# Patient Record
Sex: Female | Born: 1980 | Race: White | Hispanic: No | Smoking: Current every day smoker
Health system: Southern US, Community
[De-identification: ages and names within clinical notes are randomized; demographics above are authoritative.]

## PROBLEM LIST (undated history)

## (undated) DIAGNOSIS — C801 Malignant (primary) neoplasm, unspecified: Secondary | ICD-10-CM

## (undated) HISTORY — PX: BREAST SURGERY: SHX581

## (undated) HISTORY — PX: CHOLECYSTECTOMY: SHX55

---

## 2004-02-29 HISTORY — PX: MASTECTOMY: SHX3

## 2009-11-17 DIAGNOSIS — F17200 Nicotine dependence, unspecified, uncomplicated: Secondary | ICD-10-CM

## 2009-11-17 DIAGNOSIS — F419 Anxiety disorder, unspecified: Secondary | ICD-10-CM

## 2009-11-17 DIAGNOSIS — C50919 Malignant neoplasm of unspecified site of unspecified female breast: Secondary | ICD-10-CM

## 2009-11-17 HISTORY — DX: Malignant neoplasm of unspecified site of unspecified female breast: C50.919

## 2009-11-17 HISTORY — DX: Anxiety disorder, unspecified: F41.9

## 2009-11-17 HISTORY — DX: Nicotine dependence, unspecified, uncomplicated: F17.200

## 2010-01-14 DIAGNOSIS — J302 Other seasonal allergic rhinitis: Secondary | ICD-10-CM

## 2010-01-14 HISTORY — DX: Other seasonal allergic rhinitis: J30.2

## 2010-08-26 DIAGNOSIS — E559 Vitamin D deficiency, unspecified: Secondary | ICD-10-CM

## 2010-08-26 HISTORY — DX: Vitamin D deficiency, unspecified: E55.9

## 2011-02-07 DIAGNOSIS — B977 Papillomavirus as the cause of diseases classified elsewhere: Secondary | ICD-10-CM | POA: Insufficient documentation

## 2011-02-07 HISTORY — DX: Papillomavirus as the cause of diseases classified elsewhere: B97.7

## 2011-07-11 DIAGNOSIS — Z302 Encounter for sterilization: Secondary | ICD-10-CM

## 2011-07-11 HISTORY — DX: Encounter for sterilization: Z30.2

## 2013-06-12 DIAGNOSIS — H5210 Myopia, unspecified eye: Secondary | ICD-10-CM

## 2013-06-12 DIAGNOSIS — H52209 Unspecified astigmatism, unspecified eye: Secondary | ICD-10-CM

## 2013-06-12 DIAGNOSIS — IMO0002 Reserved for concepts with insufficient information to code with codable children: Secondary | ICD-10-CM

## 2013-06-12 HISTORY — DX: Reserved for concepts with insufficient information to code with codable children: IMO0002

## 2013-06-12 HISTORY — DX: Unspecified astigmatism, unspecified eye: H52.10

## 2013-06-12 HISTORY — DX: Unspecified astigmatism, unspecified eye: H52.209

## 2017-10-07 ENCOUNTER — Emergency Department (HOSPITAL_BASED_OUTPATIENT_CLINIC_OR_DEPARTMENT_OTHER): Payer: Self-pay

## 2017-10-07 ENCOUNTER — Other Ambulatory Visit: Payer: Self-pay

## 2017-10-07 ENCOUNTER — Emergency Department (HOSPITAL_BASED_OUTPATIENT_CLINIC_OR_DEPARTMENT_OTHER)
Admission: EM | Admit: 2017-10-07 | Discharge: 2017-10-07 | Disposition: A | Discharge status: Home / Self Care | Payer: Self-pay | Attending: Emergency Medicine | Admitting: Emergency Medicine

## 2017-10-07 ENCOUNTER — Encounter (HOSPITAL_BASED_OUTPATIENT_CLINIC_OR_DEPARTMENT_OTHER): Payer: Self-pay | Admitting: Emergency Medicine

## 2017-10-07 DIAGNOSIS — Z9010 Acquired absence of unspecified breast and nipple: Secondary | ICD-10-CM | POA: Insufficient documentation

## 2017-10-07 DIAGNOSIS — F17210 Nicotine dependence, cigarettes, uncomplicated: Secondary | ICD-10-CM | POA: Insufficient documentation

## 2017-10-07 DIAGNOSIS — Z853 Personal history of malignant neoplasm of breast: Secondary | ICD-10-CM | POA: Insufficient documentation

## 2017-10-07 DIAGNOSIS — R202 Paresthesia of skin: Secondary | ICD-10-CM | POA: Insufficient documentation

## 2017-10-07 HISTORY — DX: Malignant (primary) neoplasm, unspecified: C80.1

## 2017-10-07 MED ORDER — IBUPROFEN 400 MG PO TABS
600.0000 mg | ORAL_TABLET | Freq: Once | ORAL | Status: AC
  Administered 2017-10-07: 600 mg via ORAL
  Filled 2017-10-07: qty 1

## 2017-10-07 NOTE — ED Provider Notes (Signed)
[facility] Provider Note   CSN: [id number] Arrival date & time: 10/07/17  [place]     History   Chief Complaint Chief Complaint  Patient presents with  . Numbness    HPI Brandi Alvarado is a 37 y.o. female.  Brandi Alvarado is a 37 y.o. Female with history of breast cancer S/P mastectomy, who presents to the emergency department for evaluation of numbness and tingling in her left thumb index and middle finger, this started around 2 PM today.  She reports she is right-hand dominant, works as a Chief Operating Officer, but does not use the left hand for a lot of pouring her activities at work.  She has not noticed the symptoms prior to today.  She does report that since that she noticed this she is also noticed some tingling and discomfort going up the back of her arm, no pain in the shoulder no difficulty moving the shoulder, elbow or wrist.  No difficulty gripping objects.  Patient does report she has had some tension in her neck after sleeping on it oddly.  She denies any associated headaches, vision changes, headache she has felt dizzy occasionally she describes this as feeling "fuzzy headed" this is happened intermittently though for a long time and is not new today, she had the symptoms briefly and they have since resolved.  Patient does wear glasses and reports she has not had her prescription updated in several years.  No nausea or vomiting.  No weakness in any of her extremities, no facial asymmetry, difficulty with speech or swallowing.  She does report that she sleeps balled up and wonders if she slept oddly on the arm.  She has not taken anything prior to arrival to manage her symptoms.  No associated chest pain, or shortness of breath.      Past Medical History:  Diagnosis Date  . Cancer [facility])    breast    There are no active problems to display for this patient.   Past Surgical History:  Procedure Laterality Date  . BREAST SURGERY    . CHOLECYSTECTOMY        OB History   None      Home Medications    Prior to Admission medications   Not on File    Family History No family history on file.  Social History Social History   Tobacco Use  . Smoking status: Current Every Day Smoker  . Smokeless tobacco: Never Used  Substance Use Topics  . Alcohol use: Yes    Frequency: Never    Comment: rarely  . Drug use: Never     Allergies   Amoxicillin and Zoloft [sertraline hcl]   Review of Systems Review of Systems  Constitutional: Negative for chills and fever.  HENT: Negative for congestion, rhinorrhea and sore throat.   Eyes: Negative for visual disturbance.  Respiratory: Negative for cough and shortness of breath.   Cardiovascular: Negative for chest pain and palpitations.  Gastrointestinal: Negative for abdominal pain and nausea.  Musculoskeletal: Positive for neck pain. Negative for arthralgias, back pain, joint swelling and neck stiffness.  Skin: Negative for color change and rash.  Neurological: Positive for numbness. Negative for tremors, syncope, facial asymmetry, speech difficulty, weakness, light-headedness and headaches.       Paresthesias     Physical Exam Updated Vital Signs BP 107/61 (BP Location: Right Arm)   Pulse 60   Temp 98.5 F (36.9 C) (Oral)   Resp 16   Ht 5\' 5"  (1.651  m)   Wt 81.6 kg   LMP 10/05/2017   SpO2 99%   BMI 29.95 kg/m   Physical Exam  Constitutional: She is oriented to person, place, and time. She appears well-developed and well-nourished. No distress.  HENT:  Head: Normocephalic and atraumatic.  Eyes: Pupils are equal, round, and reactive to light. EOM are normal. Right eye exhibits no discharge. Left eye exhibits no discharge.  Neck: Normal range of motion. Neck supple.  No midline C-spine tenderness, she does have some tenderness and palpable muscle tension along the left paraspinal muscles and trapezius, range of motion intact  Cardiovascular: Normal rate, regular rhythm,  normal heart sounds and intact distal pulses.  Pulmonary/Chest: Effort normal and breath sounds normal. No respiratory distress.  Respirations equal and unlabored, patient able to speak in full sentences, lungs clear to auscultation bilaterally  Musculoskeletal:  No palpable deformity, erythema or swelling over the left hand or left upper extremity, normal range of motion of fingers and all joints  Neurological: She is alert and oriented to person, place, and time. Coordination normal.  Speech is clear, able to follow commands CN III-XII intact Normal strength in upper and lower extremities bilaterally including dorsiflexion and plantar flexion, strong and equal grip strength Sensation normal to light and sharp touch Moves extremities without ataxia, coordination intact Normal finger to nose and rapid alternating movements No pronator drift  Skin: Skin is warm and dry. She is not diaphoretic.  Psychiatric: She has a normal mood and affect. Her behavior is normal.  Nursing note and vitals reviewed.    ED Treatments / Results  Labs (all labs ordered are listed, but only abnormal results are displayed) Labs Reviewed - No data to display  EKG None  Radiology Dg Cervical Spine Complete  Result Date: 10/07/2017 CLINICAL DATA:  Numbness and tingling in left thumb, index finger, and middle finger. EXAM: CERVICAL SPINE - COMPLETE 4+ VIEW COMPARISON:  None. FINDINGS: The pre odontoid space and prevertebral soft tissues are normal. No malalignment. No fracture or bony lesion noted. The neural foramina are patent. The lateral masses of C1 align with C2. The odontoid process is normal. The lung apices are unremarkable. IMPRESSION: Negative cervical spine radiographs. Electronically Signed   By: Dorise Bullion III M.D   On: 10/07/2017 20:43    Procedures Procedures (including critical care time)  Medications Ordered in ED Medications  ibuprofen (ADVIL,MOTRIN) tablet 600 mg (600 mg Oral Given  10/07/17 2151)     Initial Impression / Assessment and Plan / ED Course  I have reviewed the triage vital signs and the nursing notes.  Pertinent labs & imaging results that were available during my care of the patient were reviewed by me and considered in my medical decision making (see chart for details).  Patient presents for evaluation of numbness and tingling in the left first 3 fingers, she is not left-hand dominant and does not have prior history of carpal tunnel or the symptoms.  Reports she has been having some intermittent disease feeling, but this is been going on much longer and is not newly associated with her symptoms today.  Patient's sensory changes are in the C7 dermatome, and I feel this could potentially be due to carpal tunnel syndrome versus cervical radiculopathy.  She does have some paraspinal muscle tension.  She has a normal neurologic exam today, and no other neurologic symptoms to suggest a central etiology.  X-ray of the C-spine shows no acute injury or degenerative change.  Will  place patient and cock-up wrist splint, and treat with anti-inflammatories, she is to follow-up with sports medicine if symptoms do not improve.  Return precautions discussed.  Patient expresses understanding and is in agreement with plan.  Final Clinical Impressions(s) / ED Diagnoses   Final diagnoses:  Paresthesia    ED Discharge Orders    None       Janet Berlin 10/08/17 2146    Isla Pence, MD 10/08/17 2223

## 2017-10-07 NOTE — ED Notes (Signed)
PMS intact before and after. Pt tolerated well. All questions answered. 

## 2017-10-07 NOTE — ED Triage Notes (Signed)
Patient states that she started to have numbness and tingling to her left thumb, index finger and middle finger around 2 pm. 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 The patient states that she is dizzy as well

## 2017-10-07 NOTE — Discharge Instructions (Signed)
I think the symptoms in your left hand are likely either due to carpal tunnel or irritation of 1 of the nerves at your neck, please use wrist splint, especially when you are sleeping, and use ibuprofen or Aleve to help with inflammation.  If symptoms are still not improving please follow-up with Dr. Karlton Lemon with sports medicine.  Return for worsening symptoms, weakness of the arm or leg, vision changes, severe headache or any other new or concerning symptoms.

## 2019-08-06 DIAGNOSIS — R269 Unspecified abnormalities of gait and mobility: Secondary | ICD-10-CM | POA: Diagnosis not present

## 2019-08-06 DIAGNOSIS — M791 Myalgia, unspecified site: Secondary | ICD-10-CM | POA: Diagnosis not present

## 2019-08-06 DIAGNOSIS — M25551 Pain in right hip: Secondary | ICD-10-CM | POA: Diagnosis not present

## 2019-08-13 DIAGNOSIS — M79671 Pain in right foot: Secondary | ICD-10-CM | POA: Diagnosis not present

## 2019-08-13 DIAGNOSIS — M25551 Pain in right hip: Secondary | ICD-10-CM | POA: Diagnosis not present

## 2019-08-13 DIAGNOSIS — M791 Myalgia, unspecified site: Secondary | ICD-10-CM | POA: Diagnosis not present

## 2019-08-13 DIAGNOSIS — M542 Cervicalgia: Secondary | ICD-10-CM | POA: Diagnosis not present

## 2019-09-03 DIAGNOSIS — M25551 Pain in right hip: Secondary | ICD-10-CM | POA: Diagnosis not present

## 2019-09-03 DIAGNOSIS — M79671 Pain in right foot: Secondary | ICD-10-CM | POA: Diagnosis not present

## 2019-09-03 DIAGNOSIS — M791 Myalgia, unspecified site: Secondary | ICD-10-CM | POA: Diagnosis not present

## 2019-09-03 DIAGNOSIS — M542 Cervicalgia: Secondary | ICD-10-CM | POA: Diagnosis not present

## 2019-09-17 DIAGNOSIS — M542 Cervicalgia: Secondary | ICD-10-CM | POA: Diagnosis not present

## 2019-09-17 DIAGNOSIS — M791 Myalgia, unspecified site: Secondary | ICD-10-CM | POA: Diagnosis not present

## 2019-09-17 DIAGNOSIS — M79671 Pain in right foot: Secondary | ICD-10-CM | POA: Diagnosis not present

## 2019-09-17 DIAGNOSIS — M25551 Pain in right hip: Secondary | ICD-10-CM | POA: Diagnosis not present

## 2019-10-09 DIAGNOSIS — R002 Palpitations: Secondary | ICD-10-CM | POA: Diagnosis not present

## 2019-10-09 DIAGNOSIS — E663 Overweight: Secondary | ICD-10-CM | POA: Diagnosis not present

## 2019-10-09 DIAGNOSIS — E559 Vitamin D deficiency, unspecified: Secondary | ICD-10-CM | POA: Diagnosis not present

## 2019-10-09 DIAGNOSIS — K219 Gastro-esophageal reflux disease without esophagitis: Secondary | ICD-10-CM | POA: Diagnosis not present

## 2019-10-09 DIAGNOSIS — T8544XA Capsular contracture of breast implant, initial encounter: Secondary | ICD-10-CM | POA: Diagnosis not present

## 2019-12-04 ENCOUNTER — Encounter: Payer: Self-pay | Admitting: Cardiology

## 2019-12-11 DIAGNOSIS — C801 Malignant (primary) neoplasm, unspecified: Secondary | ICD-10-CM | POA: Insufficient documentation

## 2019-12-13 ENCOUNTER — Other Ambulatory Visit: Payer: Self-pay

## 2019-12-13 ENCOUNTER — Ambulatory Visit (INDEPENDENT_AMBULATORY_CARE_PROVIDER_SITE_OTHER): Payer: BC Managed Care – PPO | Admitting: Cardiology

## 2019-12-13 ENCOUNTER — Encounter: Payer: Self-pay | Admitting: Cardiology

## 2019-12-13 VITALS — BP 110/80 | HR 84 | Ht 65.0 in | Wt 175.8 lb

## 2019-12-13 DIAGNOSIS — R0602 Shortness of breath: Secondary | ICD-10-CM

## 2019-12-13 DIAGNOSIS — I471 Supraventricular tachycardia: Secondary | ICD-10-CM

## 2019-12-13 MED ORDER — METOPROLOL SUCCINATE ER 25 MG PO TB24: 12.5000 mg | ORAL_TABLET | Freq: Every day | ORAL | 1 refills | Status: DC

## 2019-12-13 NOTE — Progress Notes (Signed)
Cardiology Office Note:    Date:  12/13/2019   ID:  Fabio Asa Hoelzel, DOB October 27, 1980, MRN [id number]  PCP:  Leilani Able, FNP  Cardiologist:  Berniece Salines, DO  Electrophysiologist:  None   Referring MD: Farrel Conners*   " I was referred for irregular heart rate"  History of Present Illness:    Brandi Alvarado is a 39 y.o. female with a hx of breast cancer status post lumpectomy of the left breast with chemo, history of anxiety not on any medication presents today after she was sent by her PCP due to an abnormal ambulatory monitor.  The patient reported that she did have some palpitations and she spoke with her PCP about this as a monitor was placed on the patient for 14 days and the results of the ZIO monitor was back the patient was asked to see cardiology.  Today in the office he denies any chest pain, shortness of breath, lightheadedness or dizziness.  She described a previous capitation's as abrupt onset of fast heart rate.  He has never had any syncope episode.   Past Medical History:  Diagnosis Date  . Anxiety 11/17/2009  . Breast cancer ([place]) 11/17/2009   Formatting of this note might be different from the original. L side- mastectomy Formatting of this note might be different from the original. Left breast age 4 -[name], total mastectomy with lymphadenectomy Stage 3 (29/37 nodes affected) - chemo (3 drug regimen), herceptin x 3 treatments - cardiomypathy d/c'd, did not tolerate Tamoxifen  . Cancer [facility])    breast  . Encounter for sterilization 07/11/2011  . 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 Cryo > 10 years ago  . 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 Posterior subcapsular polar nonsenile cataract 06/12/2013  . Seasonal allergies 01/14/2010  . Tobacco dependency 11/17/2009  . Vitamin D deficiency 08/26/2010    Past Surgical History:    Procedure Laterality Date  . BREAST SURGERY    . CHOLECYSTECTOMY    . MASTECTOMY  2006   Left     Current Medications: No outpatient medications have been marked as taking for the 12/13/19 encounter (Office Visit) with Berniece Salines, DO.     Allergies:   Amoxicillin, Zoloft [sertraline], Tape, and Zoloft [sertraline hcl]   Social History   Socioeconomic History  . Marital status: Married    Spouse name: Not on file  . Number of children: Not on file  . Years of education: Not on file  . Highest education level: Not on file  Occupational History  . Not on file  Tobacco Use  . Smoking status: Current Every Day Smoker    Packs/day: 0.50    Types: Cigarettes  . Smokeless tobacco: Never Used  Substance and Sexual Activity  . Alcohol use: Yes    Comment: rarely  . Drug use: Never  . Sexual activity: Not on file  Other Topics Concern  . Not on file  Social History Narrative  . Not on file   Social Determinants of Health   Financial Resource Strain:   . Difficulty of Paying Living Expenses: Not on file  Food Insecurity:   . Worried About Charity fundraiser in the Last Year: Not on file  . Ran Out of Food in the Last Year: Not on file  Transportation Needs:   . Lack of Transportation (Medical): Not on file  .  Lack of Transportation (Non-Medical): Not on file  Physical Activity:   . Days of Exercise per Week: Not on file  . Minutes of Exercise per Session: Not on file  Stress:   . Feeling of Stress : Not on file  Social Connections:   . Frequency of Communication with Friends and Family: Not on file  . Frequency of Social Gatherings with Friends and Family: Not on file  . Attends Religious Services: Not on file  . Active Member of Clubs or Organizations: Not on file  . Attends Archivist Meetings: Not on file  . Marital Status: Not on file     Family History: The patient's family history includes Colon cancer in her mother; Hypertension in her father  and sister.  ROS:   Review of Systems  Constitution: Negative for decreased appetite, fever and weight gain.  HENT: Negative for congestion, ear discharge, hoarse voice and sore throat.   Eyes: Negative for discharge, redness, vision loss in right eye and visual halos.  Cardiovascular: Palpitations.  Negative for chest pain, dyspnea on exertion, leg swelling, orthopnea. Respiratory: Negative for cough, hemoptysis, shortness of breath and snoring.   Endocrine: Negative for heat intolerance and polyphagia.  Hematologic/Lymphatic: Negative for bleeding problem. Does not bruise/bleed easily.  Skin: Negative for flushing, nail changes, rash and suspicious lesions.  Musculoskeletal: Negative for arthritis, joint pain, muscle cramps, myalgias, neck pain and stiffness.  Gastrointestinal: Negative for abdominal pain, bowel incontinence, diarrhea and excessive appetite.  Genitourinary: Negative for decreased libido, genital sores and incomplete emptying.  Neurological: Negative for brief paralysis, focal weakness, headaches and loss of balance.  Psychiatric/Behavioral: Negative for altered mental status, depression and suicidal ideas.  Allergic/Immunologic: Negative for HIV exposure and persistent infections.    EKGs/Labs/Other Studies Reviewed:    The following studies were reviewed today:   EKG:  The ekg ordered today demonstrates sinus rhythm, heart rate 84 bpm no prior EKG for comparison.  I was able to obtain a copy of the patient's ZIO monitor which show evidence of 50 episodes supraventricular tachycardia, underlying rhythm was sinus.  Minimum heart rate 40 bpm, maximum heart rate 230 bpm and average heart rate 80 bpm.  With rare PACs and PVCs.  Recent Labs: No results found for requested labs within last 8760 hours.  Recent Lipid Panel No results found for: CHOL, TRIG, HDL, CHOLHDL, VLDL, LDLCALC, LDLDIRECT  Physical Exam:    VS:  BP 110/80 (BP Location: Right Arm, Patient Position:  Sitting, Cuff Size: Normal)   Pulse 84   Ht 5\' 5"  (1.651 m)   Wt 175 lb 12.8 oz (79.7 kg)   SpO2 95%   BMI 29.25 kg/m     Wt Readings from Last 3 Encounters:  12/13/19 175 lb 12.8 oz (79.7 kg)  10/07/17 180 lb (81.6 kg)     GEN: Well nourished, well developed in no acute distress HEENT: Normal NECK: No JVD; No carotid bruits LYMPHATICS: No lymphadenopathy CARDIAC: S1S2 noted,RRR, no murmurs, rubs, gallops RESPIRATORY:  Clear to auscultation without rales, wheezing or rhonchi  ABDOMEN: Soft, non-tender, non-distended, +bowel sounds, no guarding. EXTREMITIES: No edema, No cyanosis, no clubbing MUSCULOSKELETAL:  No deformity  SKIN: Warm and dry NEUROLOGIC:  Alert and oriented x 3, non-focal PSYCHIATRIC:  Normal affect, good insight  ASSESSMENT:    1. Paroxysmal atrial tachycardia ([place])   2. Shortness of breath    PLAN:     Her monitor results is concerning for paroxysmal atrial tachycardia.  Again to  start the patient on low-dose beta-blocker metoprolol succinate 12.5 mg daily.  Educated the patient about what the rhythm beats to have particular atrial tachycardia, all of her questions has been answered.  Her husband is present during this visit.  She will need an echocardiogram to assess LV function and any other structural abnormalities in the setting of her shortness of breath.  Her blood pressure deceptively in the office no changes will be made to antihypertensive regimen.  The patient is in agreement with the above plan. The patient left the office in stable condition.  The patient will follow up in   Medication Adjustments/Labs and Tests Ordered: Current medicines are reviewed at length with the patient today.  Concerns regarding medicines are outlined above.  Orders Placed This Encounter  Procedures  . ECHOCARDIOGRAM COMPLETE   Meds ordered this encounter  Medications  . metoprolol succinate (TOPROL-XL) 25 MG 24 hr tablet    Sig: Take 0.5 tablets (12.5 mg  total) by mouth daily. Take with or immediately following a meal.    Dispense:  45 tablet    Refill:  1    Patient Instructions  Medication Instructions:  Your physician has recommended you make the following change in your medication:  START: Metoprolol succinate 12.5 mg daily   *If you need a refill on your cardiac medications before your next appointment, please call your pharmacy*   Lab Work: None.  If you have labs (blood work) drawn today and your tests are completely normal, you will receive your results only by: Marland Kitchen MyChart Message (if you have MyChart) OR . A paper copy in the mail If you have any lab test that is abnormal or we need to change your treatment, we will call you to review the results.   Testing/Procedures: Your physician has requested that you have an echocardiogram. Echocardiography is a painless test that uses sound waves to create images of your heart. It provides your doctor with information about the size and shape of your heart and how well your heart's chambers and valves are working. This procedure takes approximately one hour. There are no restrictions for this procedure.     Follow-Up: At [facility], you and your health needs are our priority.  As part of our continuing mission to provide you with exceptional heart care, we have created designated Provider Care Teams.  These Care Teams include your primary Cardiologist (physician) and Advanced Practice Providers (APPs -  Physician Assistants and Nurse Practitioners) who all work together to provide you with the care you need, when you need it.  We recommend signing up for the patient portal called "MyChart".  Sign up information is provided on this After Visit Summary.  MyChart is used to connect with patients for Virtual Visits (Telemedicine).  Patients are able to view lab/test results, encounter notes, upcoming appointments, etc.  Non-urgent messages can be sent to your provider as well.   To  learn more about what you can do with MyChart, go to [email or web address].    Your next appointment:   1 month(s)  The format for your next appointment:   In Person  Provider:   Berniece Salines, DO   Other Instructions   Echocardiogram An echocardiogram is a procedure that uses painless sound waves (ultrasound) to produce an image of the heart. Images from an echocardiogram can provide important information about:  Signs of coronary artery disease (CAD).  Aneurysm detection. An aneurysm is a weak or damaged part of an artery wall  that bulges out from the normal force of blood pumping through the body.  Heart size and shape. Changes in the size or shape of the heart can be associated with certain conditions, including heart failure, aneurysm, and CAD.  Heart muscle function.  Heart valve function.  Signs of a past heart attack.  Fluid buildup around the heart.  Thickening of the heart muscle.  A tumor or infectious growth around the heart valves. Tell a health care provider about:  Any allergies you have.  All medicines you are taking, including vitamins, herbs, eye drops, creams, and over-the-counter medicines.  Any blood disorders you have.  Any surgeries you have had.  Any medical conditions you have.  Whether you are pregnant or may be pregnant. What are the risks? Generally, this is a safe procedure. However, problems may occur, including:  Allergic reaction to dye (contrast) that may be used during the procedure. What happens before the procedure? No specific preparation is needed. You may eat and drink normally. What happens during the procedure?   An IV tube may be inserted into one of your veins.  You may receive contrast through this tube. A contrast is an injection that improves the quality of the pictures from your heart.  A gel will be applied to your chest.  A wand-like tool (transducer) will be moved over your chest. The gel will help to  transmit the sound waves from the transducer.  The sound waves will harmlessly bounce off of your heart to allow the heart images to be captured in real-time motion. The images will be recorded on a computer. The procedure may vary among health care providers and hospitals. What happens after the procedure?  You may return to your normal, everyday life, including diet, activities, and medicines, unless your health care provider tells you not to do that. Summary  An echocardiogram is a procedure that uses painless sound waves (ultrasound) to produce an image of the heart.  Images from an echocardiogram can provide important information about the size and shape of your heart, heart muscle function, heart valve function, and fluid buildup around your heart.  You do not need to do anything to prepare before this procedure. You may eat and drink normally.  After the echocardiogram is completed, you may return to your normal, everyday life, unless your health care provider tells you not to do that. This information is not intended to replace advice given to you by your health care provider. Make sure you discuss any questions you have with your health care provider. Document Revised: 06/07/2018 Document Reviewed: 03/19/2016 Elsevier Patient Education  [place].  Metoprolol Extended-Release Tablets What is this medicine? METOPROLOL (me TOE proe lole) is a beta blocker. It decreases the amount of work your heart has to do and helps your heart beat regularly. It treats high blood pressure and/or prevent chest pain (also called angina). It also treats heart failure. This medicine may be used for other purposes; ask your health care provider or pharmacist if you have questions. COMMON BRAND NAME(S): toprol, Toprol XL What should I tell my health care provider before I take this medicine? They need to know if you have any of these conditions:  diabetes  heart or vessel disease like slow  heart rate, worsening heart failure, heart block, sick sinus syndrome or Raynaud's disease  kidney disease  liver disease  lung or breathing disease, like asthma or emphysema  pheochromocytoma  thyroid disease  an unusual or allergic  reaction to metoprolol, other beta-blockers, medicines, foods, dyes, or preservatives  pregnant or trying to get pregnant  breast-feeding How should I use this medicine? Take this drug by mouth. Take it as directed on the prescription label at the same time every day. Take it with food. You may cut the tablet in half if it is scored (has a line in the middle of it). This may help you swallow the tablet if the whole tablet is too big. Be sure to take both halves. Do not take just one-half of the tablet. Keep taking it unless your health care provider tells you to stop. Talk to your health care provider about the use of this drug in children. While it may be prescribed for children as young as 6 for selected conditions, precautions do apply. Overdosage: If you think you have taken too much of this medicine contact a poison control center or emergency room at once. NOTE: This medicine is only for you. Do not share this medicine with others. What if I miss a dose? If you miss a dose, take it as soon as you can. If it is almost time for your next dose, take only that dose. Do not take double or extra doses. What may interact with this medicine? This medicine may interact with the following medications:  certain medicines for blood pressure, heart disease, irregular heart beat  certain medicines for depression, like monoamine oxidase (MAO) inhibitors, fluoxetine, or paroxetine  clonidine  dobutamine  epinephrine  isoproterenol  reserpine This list may not describe all possible interactions. Give your health care provider a list of all the medicines, herbs, non-prescription drugs, or dietary supplements you use. Also tell them if you smoke, drink  alcohol, or use illegal drugs. Some items may interact with your medicine. What should I watch for while using this medicine? Visit your doctor or health care professional for regular check ups. Contact your doctor right away if your symptoms worsen. Check your blood pressure and pulse rate regularly. Ask your health care professional what your blood pressure and pulse rate should be, and when you should contact them. You may get drowsy or dizzy. Do not drive, use machinery, or do anything that needs mental alertness until you know how this medicine affects you. Do not sit or stand up quickly, especially if you are an older patient. This reduces the risk of dizzy or fainting spells. Contact your doctor if these symptoms continue. Alcohol may interfere with the effect of this medicine. Avoid alcoholic drinks. This medicine may increase blood sugar. Ask your healthcare provider if changes in diet or medicines are needed if you have diabetes. What side effects may I notice from receiving this medicine? Side effects that you should report to your doctor or health care professional as soon as possible:  allergic reactions like skin rash, itching or hives  cold or numb hands or feet  depression  difficulty breathing  faint  fever with sore throat  irregular heartbeat, chest pain  rapid weight gain   signs and symptoms of high blood sugar such as being more thirsty or hungry or having to urinate more than normal. You may also feel very tired or have blurry vision.  swollen legs or ankles Side effects that usually do not require medical attention (report to your doctor or health care professional if they continue or are bothersome):  anxiety or nervousness  change in sex drive or performance  dry skin  headache  nightmares or trouble sleeping  short term memory loss  stomach upset or diarrhea This list may not describe all possible side effects. Call your doctor for medical advice  about side effects. You may report side effects to FDA at 1-800-FDA-1088. Where should I keep my medicine? Keep out of the reach of children and pets. Store at room temperature between 20 and 25 degrees C (68 and 77 degrees F). Throw away any unused drug after the expiration date. NOTE: This sheet is a summary. It may not cover all possible information. If you have questions about this medicine, talk to your doctor, pharmacist, or health care provider.  2020 Elsevier/Gold Standard (2018-09-27 18:23:00)       Adopting a Healthy Lifestyle.  Know what a healthy weight is for you (roughly BMI <25) and aim to maintain this   Aim for 7+ servings of fruits and vegetables daily   65-80+ fluid ounces of water or unsweet tea for healthy kidneys   Limit to max 1 drink of alcohol per day; avoid smoking/tobacco   Limit animal fats in diet for cholesterol and heart health - choose grass fed whenever available   Avoid highly processed foods, and foods high in saturated/trans fats   Aim for low stress - take time to unwind and care for your mental health   Aim for 150 min of moderate intensity exercise weekly for heart health, and weights twice weekly for bone health   Aim for 7-9 hours of sleep daily   When it comes to diets, agreement about the perfect plan isnt easy to find, even among the experts. Experts at the [employer] developed an idea known as the Healthy Eating Plate. Just imagine a plate divided into logical, healthy portions.   The emphasis is on diet quality:   Load up on vegetables and fruits - one-half of your plate: Aim for color and variety, and remember that potatoes dont count.   Go for whole grains - one-quarter of your plate: Whole wheat, barley, wheat berries, quinoa, oats, brown rice, and foods made with them. If you want pasta, go with whole wheat pasta.   Protein power - one-quarter of your plate: Fish, chicken, beans, and nuts are all  healthy, versatile protein sources. Limit red meat.   The diet, however, does go beyond the plate, offering a few other suggestions.   Use healthy plant oils, such as olive, canola, soy, corn, sunflower and peanut. Check the labels, and avoid partially hydrogenated oil, which have unhealthy trans fats.   If youre thirsty, drink water. Coffee and tea are good in moderation, but skip sugary drinks and limit milk and dairy products to one or two daily servings.   The type of carbohydrate in the diet is more important than the amount. Some sources of carbohydrates, such as vegetables, fruits, whole grains, and beans-are healthier than others.   Finally, stay active  Signed, Berniece Salines, DO  12/13/2019 11:18 PM    [facility]

## 2019-12-13 NOTE — Patient Instructions (Signed)
Medication Instructions:  Your physician has recommended you make the following change in your medication:  START: Metoprolol succinate 12.5 mg daily   *If you need a refill on your cardiac medications before your next appointment, please call your pharmacy*   Lab Work: None.  If you have labs (blood work) drawn today and your tests are completely normal, you will receive your results only by: Marland Kitchen MyChart Message (if you have MyChart) OR . A paper copy in the mail If you have any lab test that is abnormal or we need to change your treatment, we will call you to review the results.   Testing/Procedures: Your physician has requested that you have an echocardiogram. Echocardiography is a painless test that uses sound waves to create images of your heart. It provides your doctor with information about the size and shape of your heart and how well your heart's chambers and valves are working. This procedure takes approximately one hour. There are no restrictions for this procedure.     Follow-Up: At [facility], you and your health needs are our priority.  As part of our continuing mission to provide you with exceptional heart care, we have created designated Provider Care Teams.  These Care Teams include your primary Cardiologist (physician) and Advanced Practice Providers (APPs -  Physician Assistants and Nurse Practitioners) who all work together to provide you with the care you need, when you need it.  We recommend signing up for the patient portal called "MyChart".  Sign up information is provided on this After Visit Summary.  MyChart is used to connect with patients for Virtual Visits (Telemedicine).  Patients are able to view lab/test results, encounter notes, upcoming appointments, etc.  Non-urgent messages can be sent to your provider as well.   To learn more about what you can do with MyChart, go to [email or web address].    Your next appointment:   1 month(s)  The format for  your next appointment:   In Person  Provider:   Berniece Salines, DO   Other Instructions   Echocardiogram An echocardiogram is a procedure that uses painless sound waves (ultrasound) to produce an image of the heart. Images from an echocardiogram can provide important information about:  Signs of coronary artery disease (CAD).  Aneurysm detection. An aneurysm is a weak or damaged part of an artery wall that bulges out from the normal force of blood pumping through the body.  Heart size and shape. Changes in the size or shape of the heart can be associated with certain conditions, including heart failure, aneurysm, and CAD.  Heart muscle function.  Heart valve function.  Signs of a past heart attack.  Fluid buildup around the heart.  Thickening of the heart muscle.  A tumor or infectious growth around the heart valves. Tell a health care provider about:  Any allergies you have.  All medicines you are taking, including vitamins, herbs, eye drops, creams, and over-the-counter medicines.  Any blood disorders you have.  Any surgeries you have had.  Any medical conditions you have.  Whether you are pregnant or may be pregnant. What are the risks? Generally, this is a safe procedure. However, problems may occur, including:  Allergic reaction to dye (contrast) that may be used during the procedure. What happens before the procedure? No specific preparation is needed. You may eat and drink normally. What happens during the procedure?   An IV tube may be inserted into one of your veins.  You may receive contrast through  this tube. A contrast is an injection that improves the quality of the pictures from your heart.  A gel will be applied to your chest.  A wand-like tool (transducer) will be moved over your chest. The gel will help to transmit the sound waves from the transducer.  The sound waves will harmlessly bounce off of your heart to allow the heart images to be  captured in real-time motion. The images will be recorded on a computer. The procedure may vary among health care providers and hospitals. What happens after the procedure?  You may return to your normal, everyday life, including diet, activities, and medicines, unless your health care provider tells you not to do that. Summary  An echocardiogram is a procedure that uses painless sound waves (ultrasound) to produce an image of the heart.  Images from an echocardiogram can provide important information about the size and shape of your heart, heart muscle function, heart valve function, and fluid buildup around your heart.  You do not need to do anything to prepare before this procedure. You may eat and drink normally.  After the echocardiogram is completed, you may return to your normal, everyday life, unless your health care provider tells you not to do that. This information is not intended to replace advice given to you by your health care provider. Make sure you discuss any questions you have with your health care provider. Document Revised: 06/07/2018 Document Reviewed: 03/19/2016 Elsevier Patient Education  [employer].  Metoprolol Extended-Release Tablets What is this medicine? METOPROLOL (me TOE proe lole) is a beta blocker. It decreases the amount of work your heart has to do and helps your heart beat regularly. It treats high blood pressure and/or prevent chest pain (also called angina). It also treats heart failure. This medicine may be used for other purposes; ask your health care provider or pharmacist if you have questions. COMMON BRAND NAME(S): toprol, Toprol XL What should I tell my health care provider before I take this medicine? They need to know if you have any of these conditions:  diabetes  heart or vessel disease like slow heart rate, worsening heart failure, heart block, sick sinus syndrome or Raynaud's disease  kidney disease  liver disease  lung or  breathing disease, like asthma or emphysema  pheochromocytoma  thyroid disease  an unusual or allergic reaction to metoprolol, other beta-blockers, medicines, foods, dyes, or preservatives  pregnant or trying to get pregnant  breast-feeding How should I use this medicine? Take this drug by mouth. Take it as directed on the prescription label at the same time every day. Take it with food. You may cut the tablet in half if it is scored (has a line in the middle of it). This may help you swallow the tablet if the whole tablet is too big. Be sure to take both halves. Do not take just one-half of the tablet. Keep taking it unless your health care provider tells you to stop. Talk to your health care provider about the use of this drug in children. While it may be prescribed for children as young as 6 for selected conditions, precautions do apply. Overdosage: If you think you have taken too much of this medicine contact a poison control center or emergency room at once. NOTE: This medicine is only for you. Do not share this medicine with others. What if I miss a dose? If you miss a dose, take it as soon as you can. If it is almost time  for your next dose, take only that dose. Do not take double or extra doses. What may interact with this medicine? This medicine may interact with the following medications:  certain medicines for blood pressure, heart disease, irregular heart beat  certain medicines for depression, like monoamine oxidase (MAO) inhibitors, fluoxetine, or paroxetine  clonidine  dobutamine  epinephrine  isoproterenol  reserpine This list may not describe all possible interactions. Give your health care provider a list of all the medicines, herbs, non-prescription drugs, or dietary supplements you use. Also tell them if you smoke, drink alcohol, or use illegal drugs. Some items may interact with your medicine. What should I watch for while using this medicine? Visit your  doctor or health care professional for regular check ups. Contact your doctor right away if your symptoms worsen. Check your blood pressure and pulse rate regularly. Ask your health care professional what your blood pressure and pulse rate should be, and when you should contact them. You may get drowsy or dizzy. Do not drive, use machinery, or do anything that needs mental alertness until you know how this medicine affects you. Do not sit or stand up quickly, especially if you are an older patient. This reduces the risk of dizzy or fainting spells. Contact your doctor if these symptoms continue. Alcohol may interfere with the effect of this medicine. Avoid alcoholic drinks. This medicine may increase blood sugar. Ask your healthcare provider if changes in diet or medicines are needed if you have diabetes. What side effects may I notice from receiving this medicine? Side effects that you should report to your doctor or health care professional as soon as possible:  allergic reactions like skin rash, itching or hives  cold or numb hands or feet  depression  difficulty breathing  faint  fever with sore throat  irregular heartbeat, chest pain  rapid weight gain   signs and symptoms of high blood sugar such as being more thirsty or hungry or having to urinate more than normal. You may also feel very tired or have blurry vision.  swollen legs or ankles Side effects that usually do not require medical attention (report to your doctor or health care professional if they continue or are bothersome):  anxiety or nervousness  change in sex drive or performance  dry skin  headache  nightmares or trouble sleeping  short term memory loss  stomach upset or diarrhea This list may not describe all possible side effects. Call your doctor for medical advice about side effects. You may report side effects to FDA at 1-800-FDA-1088. Where should I keep my medicine? Keep out of the reach of  children and pets. Store at room temperature between 20 and 25 degrees C (68 and 77 degrees F). Throw away any unused drug after the expiration date. NOTE: This sheet is a summary. It may not cover all possible information. If you have questions about this medicine, talk to your doctor, pharmacist, or health care provider.  2020 Elsevier/Gold Standard (2018-09-27 18:23:00)

## 2019-12-17 ENCOUNTER — Other Ambulatory Visit: Payer: Self-pay

## 2019-12-17 NOTE — Addendum Note (Signed)
Addended by: Truddie Hidden on: 12/17/2019 01:08 PM   Modules accepted: Orders

## 2020-01-07 ENCOUNTER — Other Ambulatory Visit: Payer: Self-pay

## 2020-01-07 ENCOUNTER — Ambulatory Visit (INDEPENDENT_AMBULATORY_CARE_PROVIDER_SITE_OTHER): Payer: BC Managed Care – PPO

## 2020-01-07 DIAGNOSIS — R0602 Shortness of breath: Secondary | ICD-10-CM | POA: Diagnosis not present

## 2020-01-07 DIAGNOSIS — I471 Supraventricular tachycardia: Secondary | ICD-10-CM

## 2020-01-07 LAB — ECHOCARDIOGRAM COMPLETE
Area-P 1/2: 3.6 cm2
S' Lateral: 3.5 cm

## 2020-01-07 NOTE — Progress Notes (Signed)
Complete echocardiogram has been performed.  Jimmy Koray Soter RDCS, RVT 

## 2020-01-14 ENCOUNTER — Ambulatory Visit: Payer: BC Managed Care – PPO | Admitting: Cardiology

## 2020-02-07 ENCOUNTER — Encounter: Payer: Self-pay | Admitting: Cardiology

## 2020-02-07 ENCOUNTER — Ambulatory Visit (INDEPENDENT_AMBULATORY_CARE_PROVIDER_SITE_OTHER): Payer: BC Managed Care – PPO | Admitting: Cardiology

## 2020-02-07 ENCOUNTER — Other Ambulatory Visit: Payer: Self-pay

## 2020-02-07 VITALS — BP 104/70 | HR 62 | Ht 65.0 in | Wt 174.0 lb

## 2020-02-07 DIAGNOSIS — I471 Supraventricular tachycardia: Secondary | ICD-10-CM

## 2020-02-07 NOTE — Progress Notes (Signed)
Cardiology Office Note:    Date:  02/07/2020   ID:  Brandi Alvarado, DOB 10-24-1980, MRN [id number]  PCP:  Leilani Able, FNP  Cardiologist:  Berniece Salines, DO  Electrophysiologist:  None   Referring MD: Farrel Conners*   Chief Complaint  Patient presents with  . Follow-up    History of Present Illness:    Brandi Alvarado is a 39 y.o. female with a hx of paroxysmal atrial tachycardia, history of breast cancer status post lumpectomy and chemo, history anxiety is here today for follow-up visit.  I saw the patient in October at which time we discussed the monitor which is concerning for paroxysmal tachycardia I started the patient on metoprolol succinate 12.5 mg daily. She is here today for follow-up visit.  She tells me that she has not started this medication.  Her symptoms have improved some. She had an echocardiogram in the interim which was reported to be normal. No complaints today. Past Medical History:  Diagnosis Date  . Anxiety 11/17/2009  . Breast cancer ([place]) 11/17/2009   Formatting of this note might be different from the original. L side- mastectomy Formatting of this note might be different from the original. Left breast age 37 -[name], total mastectomy with lymphadenectomy Stage 3 (29/37 nodes affected) - chemo (3 drug regimen), herceptin x 3 treatments - cardiomypathy d/c'd, did not tolerate Tamoxifen  . Cancer [facility])    breast  . Encounter for sterilization 07/11/2011  . 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 Cryo > 10 years ago  . 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 Posterior subcapsular polar nonsenile cataract 06/12/2013  . Seasonal allergies 01/14/2010  . Tobacco dependency 11/17/2009  . Vitamin D deficiency 08/26/2010    Past Surgical History:  Procedure Laterality Date  . BREAST SURGERY    . CHOLECYSTECTOMY    . MASTECTOMY  2006    Left     Current Medications: No outpatient medications have been marked as taking for the 02/07/20 encounter (Office Visit) with Berniece Salines, DO.     Allergies:   Amoxicillin, Zoloft [sertraline], Tape, and Zoloft [sertraline hcl]   Social History   Socioeconomic History  . Marital status: Married    Spouse name: Not on file  . Number of children: Not on file  . Years of education: Not on file  . Highest education level: Not on file  Occupational History  . Not on file  Tobacco Use  . Smoking status: Current Every Day Smoker    Packs/day: 0.50    Types: Cigarettes  . Smokeless tobacco: Never Used  Substance and Sexual Activity  . Alcohol use: Yes    Comment: rarely  . Drug use: Never  . Sexual activity: Not on file  Other Topics Concern  . Not on file  Social History Narrative  . Not on file   Social Determinants of Health   Financial Resource Strain: Not on file  Food Insecurity: Not on file  Transportation Needs: Not on file  Physical Activity: Not on file  Stress: Not on file  Social Connections: Not on file     Family History: The patient's family history includes Colon cancer in her mother; Hypertension in her father and sister.  ROS:   Review of Systems  Constitution: Negative for decreased appetite, fever and weight gain.  HENT: Negative for congestion, ear discharge, hoarse voice and  sore throat.   Eyes: Negative for discharge, redness, vision loss in right eye and visual halos.  Cardiovascular: Negative for chest pain, dyspnea on exertion, leg swelling, orthopnea and palpitations.  Respiratory: Negative for cough, hemoptysis, shortness of breath and snoring.   Endocrine: Negative for heat intolerance and polyphagia.  Hematologic/Lymphatic: Negative for bleeding problem. Does not bruise/bleed easily.  Skin: Negative for flushing, nail changes, rash and suspicious lesions.  Musculoskeletal: Negative for arthritis, joint pain, muscle cramps,  myalgias, neck pain and stiffness.  Gastrointestinal: Negative for abdominal pain, bowel incontinence, diarrhea and excessive appetite.  Genitourinary: Negative for decreased libido, genital sores and incomplete emptying.  Neurological: Negative for brief paralysis, focal weakness, headaches and loss of balance.  Psychiatric/Behavioral: Negative for altered mental status, depression and suicidal ideas.  Allergic/Immunologic: Negative for HIV exposure and persistent infections.    EKGs/Labs/Other Studies Reviewed:    The following studies were reviewed today:   EKG: None today.  Transthoracic echocardiogram IMPRESSIONS: 1. Left ventricular ejection fraction, by estimation, is 60 to 65%. The left ventricle has normal function. The left ventricle has no regional wall motion abnormalities. Left ventricular diastolic parameters were normal.  2. Right ventricular systolic function is normal. The right ventricular size is normal. There is normal pulmonary artery systolic pressure.  3. The mitral valve is normal in structure. No evidence of mitral valve regurgitation. No evidence of mitral stenosis.  4. The aortic valve is normal in structure. Aortic valve regurgitation is not visualized. No aortic stenosis is present.  5. The inferior vena cava is normal in size with greater than 50% respiratory variability, suggesting right atrial pressure of 3 mmHg.   FINDINGS  Left Ventricle: Left ventricular ejection fraction, by estimation  Recent Labs: No results found for requested labs within last 8760 hours.  Recent Lipid Panel No results found for: CHOL, TRIG, HDL, CHOLHDL, VLDL, LDLCALC, LDLDIRECT  Physical Exam:    VS:  BP 104/70 (BP Location: Right Arm, Patient Position: Sitting, Cuff Size: Normal)   Pulse 62   Ht 5\' 5"  (1.651 m)   Wt 174 lb (78.9 kg)   SpO2 99%   BMI 28.96 kg/m     Wt Readings from Last 3 Encounters:  02/07/20 174 lb (78.9 kg)  12/13/19 175 lb 12.8 oz (79.7 kg)   10/07/17 180 lb (81.6 kg)     GEN: Well nourished, well developed in no acute distress HEENT: Normal NECK: No JVD; No carotid bruits LYMPHATICS: No lymphadenopathy CARDIAC: S1S2 noted,RRR, no murmurs, rubs, gallops RESPIRATORY:  Clear to auscultation without rales, wheezing or rhonchi  ABDOMEN: Soft, non-tender, non-distended, +bowel sounds, no guarding. EXTREMITIES: No edema, No cyanosis, no clubbing MUSCULOSKELETAL:  No deformity  SKIN: Warm and dry NEUROLOGIC:  Alert and oriented x 3, non-focal PSYCHIATRIC:  Normal affect, good insight  ASSESSMENT:    1. PAT (paroxysmal atrial tachycardia) (HCC)    PLAN:     She has not had any symptoms of palpitations since I last saw her she has not started him with medicine.  We discussed that is okay for now she can use the metoprolol as needed.  Educated the patient on how to use this medication all of her questions has been answered. Her echocardiogram results was also again shared with her no questions at this time.  The patient is in agreement with the above plan. The patient left the office in stable condition.  The patient will follow up in as needed.   Medication Adjustments/Labs and Tests Ordered:  Current medicines are reviewed at length with the patient today.  Concerns regarding medicines are outlined above.  No orders of the defined types were placed in this encounter.  No orders of the defined types were placed in this encounter.   Patient Instructions  Medication Instructions:  Your physician recommends that you continue on your current medications as directed. Please refer to the Current Medication list given to you today.  *If you need a refill on your cardiac medications before your next appointment, please call your pharmacy*   Lab Work: None If you have labs (blood work) drawn today and your tests are completely normal, you will receive your results only by: Marland Kitchen MyChart Message (if you have MyChart) OR . A paper  copy in the mail If you have any lab test that is abnormal or we need to change your treatment, we will call you to review the results.   Testing/Procedures: None   Follow-Up: At [facility], you and your health needs are our priority.  As part of our continuing mission to provide you with exceptional heart care, we have created designated Provider Care Teams.  These Care Teams include your primary Cardiologist (physician) and Advanced Practice Providers (APPs -  Physician Assistants and Nurse Practitioners) who all work together to provide you with the care you need, when you need it.  We recommend signing up for the patient portal called "MyChart".  Sign up information is provided on this After Visit Summary.  MyChart is used to connect with patients for Virtual Visits (Telemedicine).  Patients are able to view lab/test results, encounter notes, upcoming appointments, etc.  Non-urgent messages can be sent to your provider as well.   To learn more about what you can do with MyChart, go to [email or web address].    Your next appointment:   As needed  The format for your next appointment:   In Person  Provider:   Shirlee More, MD   Other Instructions      Adopting a Healthy Lifestyle.  Know what a healthy weight is for you (roughly BMI <25) and aim to maintain this   Aim for 7+ servings of fruits and vegetables daily   65-80+ fluid ounces of water or unsweet tea for healthy kidneys   Limit to max 1 drink of alcohol per day; avoid smoking/tobacco   Limit animal fats in diet for cholesterol and heart health - choose grass fed whenever available   Avoid highly processed foods, and foods high in saturated/trans fats   Aim for low stress - take time to unwind and care for your mental health   Aim for 150 min of moderate intensity exercise weekly for heart health, and weights twice weekly for bone health   Aim for 7-9 hours of sleep daily   When it comes to diets,  agreement about the perfect plan isnt easy to find, even among the experts. Experts at the [employer] developed an idea known as the Healthy Eating Plate. Just imagine a plate divided into logical, healthy portions.   The emphasis is on diet quality:   Load up on vegetables and fruits - one-half of your plate: Aim for color and variety, and remember that potatoes dont count.   Go for whole grains - one-quarter of your plate: Whole wheat, barley, wheat berries, quinoa, oats, brown rice, and foods made with them. If you want pasta, go with whole wheat pasta.   Protein power - one-quarter of your plate: Fish,  chicken, beans, and nuts are all healthy, versatile protein sources. Limit red meat.   The diet, however, does go beyond the plate, offering a few other suggestions.   Use healthy plant oils, such as olive, canola, soy, corn, sunflower and peanut. Check the labels, and avoid partially hydrogenated oil, which have unhealthy trans fats.   If youre thirsty, drink water. Coffee and tea are good in moderation, but skip sugary drinks and limit milk and dairy products to one or two daily servings.   The type of carbohydrate in the diet is more important than the amount. Some sources of carbohydrates, such as vegetables, fruits, whole grains, and beans-are healthier than others.   Finally, stay active  Signed, Berniece Salines, DO  02/07/2020 2:52 PM    [facility]

## 2020-02-07 NOTE — Patient Instructions (Signed)

## 2020-04-15 ENCOUNTER — Other Ambulatory Visit: Payer: Self-pay

## 2020-04-15 ENCOUNTER — Ambulatory Visit (INDEPENDENT_AMBULATORY_CARE_PROVIDER_SITE_OTHER): Payer: BC Managed Care – PPO | Admitting: Plastic Surgery

## 2020-04-15 ENCOUNTER — Encounter: Payer: Self-pay | Admitting: Plastic Surgery

## 2020-04-15 ENCOUNTER — Institutional Professional Consult (permissible substitution): Payer: BC Managed Care – PPO | Admitting: Plastic Surgery

## 2020-04-15 VITALS — BP 123/78 | HR 63 | Ht 65.0 in | Wt 177.8 lb

## 2020-04-15 DIAGNOSIS — C50919 Malignant neoplasm of unspecified site of unspecified female breast: Secondary | ICD-10-CM | POA: Diagnosis not present

## 2020-04-15 NOTE — Progress Notes (Signed)
Referring Provider Leilani Able, [address] [place],  [place] [address]   CC:  Chief Complaint  Patient presents with  . Advice Only      Brandi Alvarado is an 40 y.o. female.  HPI: Patient presents with concerns regarding her left breast implant.  She had a mastectomy done years ago and had a submuscular implant placed at that time.  About 2 years ago she noticed some rippling along the inferior surface of the breast and had the implant exchanged.  She felt things improved for period of time but subsequently noticed the rippling again along her inferior pocket and wants to see if anything can be done about that.  She does not have much pain.  She is otherwise happy with the position of the implant.  She would like it to be a little bit smaller if possible to match her right side.  Allergies  Allergen Reactions  . Amoxicillin Nausea Only  . Zoloft [Sertraline] Hives and Rash  . Tape Rash  . Zoloft [Sertraline Hcl] Rash    Outpatient Encounter Medications as of 04/15/2020  Medication Sig  . ibuprofen (ADVIL) 400 MG tablet Take 400 mg by mouth every 6 (six) hours as needed.  . metoprolol succinate (TOPROL-XL) 25 MG 24 hr tablet Take 0.5 tablets (12.5 mg total) by mouth daily. Take with or immediately following a meal. (Patient not taking: Reported on 02/07/2020)   No facility-administered encounter medications on file as of 04/15/2020.     Past Medical History:  Diagnosis Date  . Anxiety 11/17/2009  . Breast cancer ([place]) 11/17/2009   Formatting of this note might be different from the original. L side- mastectomy Formatting of this note might be different from the original. Left breast age 65 -[name], total mastectomy with lymphadenectomy Stage 3 (29/37 nodes affected) - chemo (3 drug regimen), herceptin x 3 treatments - cardiomypathy d/c'd, did not tolerate Tamoxifen  . Cancer [facility])    breast  . Encounter for sterilization 07/11/2011  . 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 Cryo > 10 years ago  . 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 Posterior subcapsular polar nonsenile cataract 06/12/2013  . Seasonal allergies 01/14/2010  . Tobacco dependency 11/17/2009  . Vitamin D deficiency 08/26/2010    Past Surgical History:  Procedure Laterality Date  . BREAST SURGERY    . CHOLECYSTECTOMY    . MASTECTOMY  2006   Left     Family History  Problem Relation Age of Onset  . Colon cancer Mother   . Hypertension Father   . Hypertension Sister     Social History   Social History Narrative  . Not on file     Review of Systems General: Denies fevers, chills, weight loss CV: Denies chest pain, shortness of breath, palpitations  Physical Exam Vitals with BMI 04/15/2020 02/07/2020 12/13/2019  Height 5\' 5"  5\' 5"  5\' 5"   Weight 177 lbs 13 oz 174 lbs 175 lbs 13 oz  BMI 29.59 32.44 01.02  Systolic 725 366 440  Diastolic 78 70 80  Pulse 63 62 84    General:  No acute distress,  Alert and oriented, Non-Toxic, Normal speech and affect On exam she has a nice implant-based reconstruction on the left side.  The base width of the implant pocket is about 12.5 cm.  The position of the implant is good.  The scar is oblique and central.  I do feel some rippling along the inferior surface.  This is clearly a submuscular implant.  I do feel that the pectoralis is only covering the superior portion which makes the inferior portion more palpable.  She has had a small lift on the right side  Assessment/Plan Patient presents with rippling after implant-based.  Breast reconstruction on the left side.  I think this could be improved upon with a more cohesive implant and the use of acellular dermal matrix along the inferior portion of the pocket.  I would like to obtain her records to see if I can figure out exactly what implant she has been and what has been  done in the past.  She likes the plan and is interested in moving forward.  We discussed the risks include bleeding, infection, damage to surrounding structures and need for additional procedures.  I explained that I would more than likely 1 is a drain if I use a significant sized piece of acellular dermal matrix.  She is in agreement and will plan to move forward with this.  Brandi Alvarado 04/15/2020, 11:59 AM

## 2020-06-02 ENCOUNTER — Ambulatory Visit (INDEPENDENT_AMBULATORY_CARE_PROVIDER_SITE_OTHER): Payer: BC Managed Care – PPO | Admitting: Surgical

## 2020-06-02 ENCOUNTER — Encounter: Payer: Self-pay | Admitting: Surgical

## 2020-06-02 ENCOUNTER — Other Ambulatory Visit: Payer: Self-pay

## 2020-06-02 VITALS — BP 103/62 | HR 57 | Ht 65.0 in | Wt 176.8 lb

## 2020-06-02 DIAGNOSIS — C50919 Malignant neoplasm of unspecified site of unspecified female breast: Secondary | ICD-10-CM

## 2020-06-02 MED ORDER — ONDANSETRON HCL 4 MG PO TABS: 4.0000 mg | ORAL_TABLET | Freq: Three times a day (TID) | ORAL | 0 refills | Status: DC | PRN

## 2020-06-02 MED ORDER — SULFAMETHOXAZOLE-TRIMETHOPRIM 800-160 MG PO TABS: 1.0000 | ORAL_TABLET | Freq: Two times a day (BID) | ORAL | 0 refills | Status: AC

## 2020-06-02 MED ORDER — HYDROCODONE-ACETAMINOPHEN 7.5-325 MG PO TABS: 1.0000 | ORAL_TABLET | Freq: Four times a day (QID) | ORAL | 0 refills | Status: AC | PRN

## 2020-06-02 NOTE — H&P (View-Only) (Signed)
Patient ID: Brandi Alvarado, female    DOB: 04/22/1980, 40 y.o.   MRN: [id number]  Chief Complaint  Patient presents with  . Pre-op Exam      ICD-10-CM   1. Malignant neoplasm of female breast, unspecified estrogen receptor status, unspecified laterality, unspecified site of breast ([place])  C50.919      History of Present Illness: Brandi Alvarado is a 40 y.o.  female  with a history of left mastectomy followed by placement of a submuscular implant.  She presents for preoperative evaluation for upcoming procedure, revision of left breast reconstruction with exchange of gel implant and placement of acellular dermal matrix, scheduled for 06/23/2020 with Dr. Claudia Desanctis.  The patient has not had problems with anesthesia. No history of DVT/PE.  No family history of DVT/PE.  No family or personal history of bleeding or clotting disorders.  Patient is not currently taking any blood thinners.  No history of CVA/MI.   Summary of Previous Visit: Patient had a mastectomy done years ago and had a submuscular implant placed at that time.  About 2 years ago she noticed some rippling along the inferior surface of the breast and had the implant exchange.  She felt things improved for period of time but subsequently noticed rippling again.  She would like to be a bit smaller if possible to match her right side.  Patient is currently smoking half pack per day  Job: Psychiatrist, planning 1 month out of work  [place] Significant for: Breast cancer, status post chemotherapy. Paroxysmal atrial tachycardia She reports she occasionally has palpitations, reports she has discussed this with her cardiology and there is no need for additional treatment besides metoprolol as needed.  She reports she has not had to use this at all.  She is feeling well  Of note, we received her records from [facility] where she had her most recent implants placed.  She underwent removal of the left breast implant, complete  capsulectomy and reimplantation of the left breast implant on 10/09/2018.  She had a 440 mL implant placed.  This was [facility] Mentor implant.  Past Medical History: Allergies: Allergies  Allergen Reactions  . Amoxicillin Nausea Only  . Zoloft [Sertraline] Hives and Rash  . Tape Rash  . Zoloft [Sertraline Hcl] Rash    Current Medications:  Current Outpatient Medications:  .  HYDROcodone-acetaminophen (NORCO) 7.5-325 MG tablet, Take 1 tablet by mouth every 6 (six) hours as needed for up to 5 days for severe pain (Severe pain, post op pain only.)., Disp: 20 tablet, Rfl: 0 .  ondansetron (ZOFRAN) 4 MG tablet, Take 1 tablet (4 mg total) by mouth every 8 (eight) hours as needed for nausea or vomiting., Disp: 20 tablet, Rfl: 0 .  sulfamethoxazole-trimethoprim (BACTRIM DS) 800-160 MG tablet, Take 1 tablet by mouth 2 (two) times daily for 7 days., Disp: 14 tablet, Rfl: 0 .  ibuprofen (ADVIL) 400 MG tablet, Take 400 mg by mouth every 6 (six) hours as needed., Disp: , Rfl:  .  metoprolol succinate (TOPROL-XL) 25 MG 24 hr tablet, Take 0.5 tablets (12.5 mg total) by mouth daily. Take with or immediately following a meal. (Patient not taking: Reported on 02/07/2020), Disp: 45 tablet, Rfl: 1  Past Medical Problems: Past Medical History:  Diagnosis Date  . Anxiety 11/17/2009  . Breast cancer ([place]) 11/17/2009   Formatting of this note might be different from the original. L side- mastectomy Formatting of this note might be different from  the original. Left breast age 69 -[name], total mastectomy with lymphadenectomy Stage 3 (29/37 nodes affected) - chemo (3 drug regimen), herceptin x 3 treatments - cardiomypathy d/c'd, did not tolerate Tamoxifen  . Cancer [facility])    breast  . Encounter for sterilization 07/11/2011  . 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 Cryo > 10 years ago  . Myopia with  astigmatism 06/12/2013  . Posterior subcapsular polar nonsenile cataract 06/12/2013  . Seasonal allergies 01/14/2010  . Tobacco dependency 11/17/2009  . Vitamin D deficiency 08/26/2010    Past Surgical History: Past Surgical History:  Procedure Laterality Date  . BREAST SURGERY    . CHOLECYSTECTOMY    . MASTECTOMY  2006   Left     Social History: Social History   Socioeconomic History  . Marital status: Married    Spouse name: Not on file  . Number of children: Not on file  . Years of education: Not on file  . Highest education level: Not on file  Occupational History  . Not on file  Tobacco Use  . Smoking status: Current Every Day Smoker    Packs/day: 0.50    Types: Cigarettes  . Smokeless tobacco: Never Used  Substance and Sexual Activity  . Alcohol use: Yes    Comment: rarely  . Drug use: Never  . Sexual activity: Not on file  Other Topics Concern  . Not on file  Social History Narrative  . Not on file   Social Determinants of Health   Financial Resource Strain: Not on file  Food Insecurity: Not on file  Transportation Needs: Not on file  Physical Activity: Not on file  Stress: Not on file  Social Connections: Not on file  Intimate Partner Violence: Not on file    Family History: Family History  Problem Relation Age of Onset  . Colon cancer Mother   . Hypertension Father   . Hypertension Sister     Review of Systems: Review of Systems  Constitutional: Negative.   Respiratory: Negative.   Cardiovascular: Negative.   Gastrointestinal: Negative.   Neurological: Negative.     Physical Exam: Vital Signs BP 103/62 (BP Location: Right Arm, Patient Position: Sitting, Cuff Size: Large)   Pulse (!) 57   Ht 5\' 5"  (1.651 m)   Wt 176 lb 12.8 oz (80.2 kg)   LMP 05/20/2020 (Exact Date)   SpO2 98%   BMI 29.42 kg/m   Physical Exam Constitutional:      General: Not in acute distress.    Appearance: Normal appearance. Not ill-appearing.  HENT:      Head: Normocephalic and atraumatic.  Eyes:     Pupils: Pupils are equal, round Neck:     Musculoskeletal: Normal range of motion.  Cardiovascular:     Rate and Rhythm: Normal rate    Pulses: Normal pulses.  Pulmonary:     Effort: Pulmonary effort is normal. No respiratory distress.  Abdominal:     General: Abdomen is flat. There is no distension.  Musculoskeletal: Normal range of motion.  Skin:    General: Skin is warm and dry.     Findings: No erythema or rash.  Neurological:     General: No focal deficit present.     Mental Status: Alert and oriented to person, place, and time. Mental status is at baseline.     Motor: No weakness.  Psychiatric:  Mood and Affect: Mood normal.        Behavior: Behavior normal.    Assessment/Plan: The patient is scheduled for revision of left breast reconstruction with exchange for new gel implant placement of acellular dermal matrix with Dr. Claudia Desanctis.  Risks, benefits, and alternatives of procedure discussed, questions answered and consent obtained.    Smoking Status: Half pack per day; Counseling Given?  Discussed increased risks of wound healing, postoperative complications  Caprini Score: 6, high; Risk Factors include: Age, BMI greater than 25, history of breast cancer and length of planned surgery. Recommendation for mechanical and pharmacological prophylaxis for 7 to 10 days postoperatively. Encourage early ambulation.    Pictures obtained: 04/15/2020  Post-op Rx sent to pharmacy: Norco, Zofran, Bactrim  Patient was provided with the General Surgical Risk consent document and Pain Medication Agreement prior to their appointment.  They had adequate time to read through the risk consent documents and Pain Medication Agreement. We also discussed them in person together during this preop appointment. All of their questions were answered to their satisfaction.  Recommended calling if they have any further questions.  Risk consent form and Pain  Medication Agreement to be scanned into patient's chart.  Patient was provided with the Mentor implant patient decision checklist and this was completed during today's preoperative evaluation. Patient had time to read through the information and any questions were answered to their content. Form will be scanned into patient's chart.  The risks that can be encountered with and after placement of a breast implant were discussed and include the following but not limited to these: bleeding, infection, delayed healing, anesthesia risks, skin sensation changes, injury to structures including nerves, blood vessels, and muscles which may be temporary or permanent, allergies to tape, suture materials and glues, blood products, topical preparations or injected agents, skin contour irregularities, skin discoloration and swelling, deep vein thrombosis, cardiac and pulmonary complications, pain, which may persist, fluid accumulation, wrinkling of the skin over the implanmt, changes in nipple or breast sensation, implant leakage or rupture, faulty position of the implant, persistent pain, formation of tight scar tissue around the implant (capsular contracture).   Electronically signed by: Carola Rhine Gissela Bloch, PA-C 06/02/2020 2:49 PM

## 2020-06-02 NOTE — Progress Notes (Signed)
Patient ID: Brandi Alvarado, female    DOB: [birth year], 40 y.o.   MRN: [id number]  Chief Complaint  Patient presents with  . Pre-op Exam      ICD-10-CM   1. Malignant neoplasm of female breast, unspecified estrogen receptor status, unspecified laterality, unspecified site of breast ([place])  C50.919      History of Present Illness: Brandi Alvarado is a 40 y.o.  female  with a history of left mastectomy followed by placement of a submuscular implant.  She presents for preoperative evaluation for upcoming procedure, revision of left breast reconstruction with exchange of gel implant and placement of acellular dermal matrix, scheduled for 06/23/2020 with Dr. Claudia Desanctis.  The patient has not had problems with anesthesia. No history of DVT/PE.  No family history of DVT/PE.  No family or personal history of bleeding or clotting disorders.  Patient is not currently taking any blood thinners.  No history of CVA/MI.   Summary of Previous Visit: Patient had a mastectomy done years ago and had a submuscular implant placed at that time.  About 2 years ago she noticed some rippling along the inferior surface of the breast and had the implant exchange.  She felt things improved for period of time but subsequently noticed rippling again.  She would like to be a bit smaller if possible to match her right side.  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 Paroxysmal atrial tachycardia She reports she occasionally has palpitations, reports she has discussed this with her cardiology and there is no need for additional treatment besides metoprolol as needed.  She reports she has not had to use this at all.  She is feeling well  Of note, we received her records from [facility] where she had her most recent implants placed.  She underwent removal of the left breast implant, complete  capsulectomy and reimplantation of the left breast implant on 10/09/2018.  She had a 440 mL implant placed.  This was [facility] Mentor implant.  Past Medical History: Allergies: Allergies  Allergen Reactions  . Amoxicillin Nausea Only  . Zoloft [Sertraline] Hives and Rash  . Tape Rash  . Zoloft [Sertraline Hcl] Rash    Current Medications:  Current Outpatient Medications:  .  HYDROcodone-acetaminophen (NORCO) 7.5-325 MG tablet, Take 1 tablet by mouth every 6 (six) hours as needed for up to 5 days for severe pain (Severe pain, post op pain only.)., Disp: 20 tablet, Rfl: 0 .  ondansetron (ZOFRAN) 4 MG tablet, Take 1 tablet (4 mg total) by mouth every 8 (eight) hours as needed for nausea or vomiting., Disp: 20 tablet, Rfl: 0 .  sulfamethoxazole-trimethoprim (BACTRIM DS) 800-160 MG tablet, Take 1 tablet by mouth 2 (two) times daily for 7 days., Disp: 14 tablet, Rfl: 0 .  ibuprofen (ADVIL) 400 MG tablet, Take 400 mg by mouth every 6 (six) hours as needed., Disp: , Rfl:  .  metoprolol succinate (TOPROL-XL) 25 MG 24 hr tablet, Take 0.5 tablets (12.5 mg total) by mouth daily. Take with or immediately following a meal. (Patient not taking: Reported on 02/07/2020), Disp: 45 tablet, Rfl: 1  Past Medical Problems: Past Medical History:  Diagnosis Date  . Anxiety 11/17/2009  . Breast cancer ([place]) 11/17/2009   Formatting of this note might be different from the original. L side- mastectomy Formatting of this note might be different from  the original. Left breast age 67 -[name], total mastectomy with lymphadenectomy Stage 3 (29/37 nodes affected) - chemo (3 drug regimen), herceptin x 3 treatments - cardiomypathy d/c'd, did not tolerate Tamoxifen  . Cancer [facility])    breast  . Encounter for sterilization 07/11/2011  . 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 Cryo > 10 years ago  . Myopia with  astigmatism 06/12/2013  . Posterior subcapsular polar nonsenile cataract 06/12/2013  . Seasonal allergies 01/14/2010  . Tobacco dependency 11/17/2009  . Vitamin D deficiency 08/26/2010    Past Surgical History: Past Surgical History:  Procedure Laterality Date  . BREAST SURGERY    . CHOLECYSTECTOMY    . MASTECTOMY  2006   Left     Social History: Social History   Socioeconomic History  . Marital status: Married    Spouse name: Not on file  . Number of children: Not on file  . Years of education: Not on file  . Highest education level: Not on file  Occupational History  . Not on file  Tobacco Use  . Smoking status: Current Every Day Smoker    Packs/day: 0.50    Types: Cigarettes  . Smokeless tobacco: Never Used  Substance and Sexual Activity  . Alcohol use: Yes    Comment: rarely  . Drug use: Never  . Sexual activity: Not on file  Other Topics Concern  . Not on file  Social History Narrative  . Not on file   Social Determinants of Health   Financial Resource Strain: Not on file  Food Insecurity: Not on file  Transportation Needs: Not on file  Physical Activity: Not on file  Stress: Not on file  Social Connections: Not on file  Intimate Partner Violence: Not on file    Family History: Family History  Problem Relation Age of Onset  . Colon cancer Mother   . Hypertension Father   . Hypertension Sister     Review of Systems: Review of Systems  Constitutional: Negative.   Respiratory: Negative.   Cardiovascular: Negative.   Gastrointestinal: Negative.   Neurological: Negative.     Physical Exam: Vital Signs BP 103/62 (BP Location: Right Arm, Patient Position: Sitting, Cuff Size: Large)   Pulse (!) 57   Ht 5\' 5"  (1.651 m)   Wt 176 lb 12.8 oz (80.2 kg)   LMP 05/20/2020 (Exact Date)   SpO2 98%   BMI 29.42 kg/m   Physical Exam Constitutional:      General: Not in acute distress.    Appearance: Normal appearance. Not ill-appearing.  HENT:      Head: Normocephalic and atraumatic.  Eyes:     Pupils: Pupils are equal, round Neck:     Musculoskeletal: Normal range of motion.  Cardiovascular:     Rate and Rhythm: Normal rate    Pulses: Normal pulses.  Pulmonary:     Effort: Pulmonary effort is normal. No respiratory distress.  Abdominal:     General: Abdomen is flat. There is no distension.  Musculoskeletal: Normal range of motion.  Skin:    General: Skin is warm and dry.     Findings: No erythema or rash.  Neurological:     General: No focal deficit present.     Mental Status: Alert and oriented to person, place, and time. Mental status is at baseline.     Motor: No weakness.  Psychiatric:  Mood and Affect: Mood normal.        Behavior: Behavior normal.    Assessment/Plan: The patient is scheduled for revision of left breast reconstruction with exchange for new gel implant placement of acellular dermal matrix with Dr. Claudia Desanctis.  Risks, benefits, and alternatives of procedure discussed, questions answered and consent obtained.    Smoking Status: Half pack per day; Counseling Given?  Discussed increased risks of wound healing, postoperative complications  Caprini Score: 6, high; Risk Factors include: Age, BMI greater than 25, history of breast cancer and length of planned surgery. Recommendation for mechanical and pharmacological prophylaxis for 7 to 10 days postoperatively. Encourage early ambulation.    Pictures obtained: 04/15/2020  Post-op Rx sent to pharmacy: Norco, Zofran, Bactrim  Patient was provided with the General Surgical Risk consent document and Pain Medication Agreement prior to their appointment.  They had adequate time to read through the risk consent documents and Pain Medication Agreement. We also discussed them in person together during this preop appointment. All of their questions were answered to their satisfaction.  Recommended calling if they have any further questions.  Risk consent form and Pain  Medication Agreement to be scanned into patient's chart.  Patient was provided with the Mentor implant patient decision checklist and this was completed during today's preoperative evaluation. Patient had time to read through the information and any questions were answered to their content. Form will be scanned into patient's chart.  The risks that can be encountered with and after placement of a breast implant were discussed and include the following but not limited to these: bleeding, infection, delayed healing, anesthesia risks, skin sensation changes, injury to structures including nerves, blood vessels, and muscles which may be temporary or permanent, allergies to tape, suture materials and glues, blood products, topical preparations or injected agents, skin contour irregularities, skin discoloration and swelling, deep vein thrombosis, cardiac and pulmonary complications, pain, which may persist, fluid accumulation, wrinkling of the skin over the implanmt, changes in nipple or breast sensation, implant leakage or rupture, faulty position of the implant, persistent pain, formation of tight scar tissue around the implant (capsular contracture).   Electronically signed by: Carola Rhine Perri Lamagna, PA-C 06/02/2020 2:49 PM

## 2020-06-05 ENCOUNTER — Telehealth: Payer: Self-pay

## 2020-06-05 NOTE — Telephone Encounter (Signed)
Returned patients call. Advised her to fax information to our office attention Dr. Claudia Desanctis. Will provide copy to Dr. Claudia Desanctis once received.

## 2020-06-05 NOTE — Telephone Encounter (Signed)
Patient called to say that she is having surgery at the end of this month and she found the information on her current implant.  Please call.

## 2020-06-15 ENCOUNTER — Encounter (HOSPITAL_BASED_OUTPATIENT_CLINIC_OR_DEPARTMENT_OTHER): Payer: Self-pay | Admitting: Plastic Surgery

## 2020-06-15 ENCOUNTER — Other Ambulatory Visit: Payer: Self-pay

## 2020-06-19 ENCOUNTER — Telehealth: Payer: Self-pay

## 2020-06-19 MED ORDER — HYDROCODONE-ACETAMINOPHEN 5-325 MG PO TABS: 1.0000 | ORAL_TABLET | Freq: Four times a day (QID) | ORAL | 0 refills | Status: AC | PRN

## 2020-06-19 MED ORDER — SULFAMETHOXAZOLE-TRIMETHOPRIM 800-160 MG PO TABS: 1.0000 | ORAL_TABLET | Freq: Two times a day (BID) | ORAL | 0 refills | Status: AC

## 2020-06-19 MED ORDER — ONDANSETRON HCL 4 MG PO TABS: 4.0000 mg | ORAL_TABLET | Freq: Three times a day (TID) | ORAL | 0 refills | Status: DC | PRN

## 2020-06-19 NOTE — Telephone Encounter (Signed)
Patient would like medications sent to the following pharmacy instead of [facility]:   [facility] [place], [address] AT [facility] OF [address] Phone:  [phone]  Fax:  [phone]

## 2020-06-22 ENCOUNTER — Other Ambulatory Visit (HOSPITAL_COMMUNITY)
Admission: RE | Admit: 2020-06-22 | Discharge: 2020-06-22 | Disposition: A | Discharge status: Home / Self Care | Payer: BC Managed Care – PPO | Source: Ambulatory Visit | Attending: Plastic Surgery | Admitting: Plastic Surgery

## 2020-06-22 DIAGNOSIS — Z880 Allergy status to penicillin: Secondary | ICD-10-CM | POA: Diagnosis not present

## 2020-06-22 DIAGNOSIS — Z9049 Acquired absence of other specified parts of digestive tract: Secondary | ICD-10-CM | POA: Diagnosis not present

## 2020-06-22 DIAGNOSIS — Z79899 Other long term (current) drug therapy: Secondary | ICD-10-CM | POA: Diagnosis not present

## 2020-06-22 DIAGNOSIS — Z20822 Contact with and (suspected) exposure to covid-19: Secondary | ICD-10-CM | POA: Diagnosis not present

## 2020-06-22 DIAGNOSIS — T85898A Other specified complication of other internal prosthetic devices, implants and grafts, initial encounter: Secondary | ICD-10-CM | POA: Diagnosis not present

## 2020-06-22 DIAGNOSIS — Z853 Personal history of malignant neoplasm of breast: Secondary | ICD-10-CM | POA: Diagnosis not present

## 2020-06-22 DIAGNOSIS — Z888 Allergy status to other drugs, medicaments and biological substances status: Secondary | ICD-10-CM | POA: Diagnosis not present

## 2020-06-22 DIAGNOSIS — F17210 Nicotine dependence, cigarettes, uncomplicated: Secondary | ICD-10-CM | POA: Diagnosis not present

## 2020-06-22 DIAGNOSIS — Z01812 Encounter for preprocedural laboratory examination: Secondary | ICD-10-CM | POA: Insufficient documentation

## 2020-06-22 DIAGNOSIS — X58XXXA Exposure to other specified factors, initial encounter: Secondary | ICD-10-CM | POA: Diagnosis not present

## 2020-06-22 DIAGNOSIS — Z9012 Acquired absence of left breast and nipple: Secondary | ICD-10-CM | POA: Diagnosis not present

## 2020-06-22 DIAGNOSIS — Z800 Family history of malignant neoplasm of digestive organs: Secondary | ICD-10-CM | POA: Diagnosis not present

## 2020-06-23 ENCOUNTER — Other Ambulatory Visit: Payer: Self-pay

## 2020-06-23 ENCOUNTER — Encounter (HOSPITAL_BASED_OUTPATIENT_CLINIC_OR_DEPARTMENT_OTHER): Payer: Self-pay | Admitting: Plastic Surgery

## 2020-06-23 ENCOUNTER — Ambulatory Visit (HOSPITAL_BASED_OUTPATIENT_CLINIC_OR_DEPARTMENT_OTHER): Payer: BC Managed Care – PPO | Admitting: Anesthesiology

## 2020-06-23 ENCOUNTER — Encounter (HOSPITAL_BASED_OUTPATIENT_CLINIC_OR_DEPARTMENT_OTHER)
Admission: RE | Disposition: A | Discharge status: Home / Self Care | Payer: Self-pay | Source: Ambulatory Visit | Attending: Plastic Surgery

## 2020-06-23 ENCOUNTER — Ambulatory Visit (HOSPITAL_BASED_OUTPATIENT_CLINIC_OR_DEPARTMENT_OTHER)
Admission: RE | Admit: 2020-06-23 | Discharge: 2020-06-23 | Disposition: A | Discharge status: Home / Self Care | Payer: BC Managed Care – PPO | Source: Ambulatory Visit | Attending: Plastic Surgery | Admitting: Plastic Surgery

## 2020-06-23 DIAGNOSIS — Z20822 Contact with and (suspected) exposure to covid-19: Secondary | ICD-10-CM | POA: Insufficient documentation

## 2020-06-23 DIAGNOSIS — Z888 Allergy status to other drugs, medicaments and biological substances status: Secondary | ICD-10-CM | POA: Insufficient documentation

## 2020-06-23 DIAGNOSIS — Z800 Family history of malignant neoplasm of digestive organs: Secondary | ICD-10-CM | POA: Insufficient documentation

## 2020-06-23 DIAGNOSIS — T85898A Other specified complication of other internal prosthetic devices, implants and grafts, initial encounter: Secondary | ICD-10-CM | POA: Diagnosis not present

## 2020-06-23 DIAGNOSIS — Z853 Personal history of malignant neoplasm of breast: Secondary | ICD-10-CM | POA: Insufficient documentation

## 2020-06-23 DIAGNOSIS — Z880 Allergy status to penicillin: Secondary | ICD-10-CM | POA: Insufficient documentation

## 2020-06-23 DIAGNOSIS — Z9012 Acquired absence of left breast and nipple: Secondary | ICD-10-CM | POA: Insufficient documentation

## 2020-06-23 DIAGNOSIS — N650 Deformity of reconstructed breast: Secondary | ICD-10-CM

## 2020-06-23 DIAGNOSIS — Z9049 Acquired absence of other specified parts of digestive tract: Secondary | ICD-10-CM | POA: Insufficient documentation

## 2020-06-23 DIAGNOSIS — F17210 Nicotine dependence, cigarettes, uncomplicated: Secondary | ICD-10-CM | POA: Insufficient documentation

## 2020-06-23 DIAGNOSIS — X58XXXA Exposure to other specified factors, initial encounter: Secondary | ICD-10-CM | POA: Insufficient documentation

## 2020-06-23 DIAGNOSIS — Z79899 Other long term (current) drug therapy: Secondary | ICD-10-CM | POA: Insufficient documentation

## 2020-06-23 HISTORY — PX: BREAST CAPSULECTOMY WITH IMPLANT EXCHANGE: SHX5592

## 2020-06-23 LAB — SARS CORONAVIRUS 2 (TAT 6-24 HRS): SARS Coronavirus 2: NEGATIVE

## 2020-06-23 LAB — POCT PREGNANCY, URINE: Preg Test, Ur: NEGATIVE

## 2020-06-23 SURGERY — CAPSULECTOMY, BREAST, WITH REPLACEMENT OF IMPLANT
Anesthesia: General | Site: Breast | Laterality: Left

## 2020-06-23 MED ORDER — OXYCODONE HCL 5 MG/5ML PO SOLN: 5.0000 mg | Freq: Once | ORAL | Status: DC | PRN

## 2020-06-23 MED ORDER — MIDAZOLAM HCL 2 MG/2ML IJ SOLN
INTRAMUSCULAR | Status: AC
  Filled 2020-06-23: qty 2

## 2020-06-23 MED ORDER — DEXAMETHASONE SODIUM PHOSPHATE 10 MG/ML IJ SOLN
INTRAMUSCULAR | Status: DC | PRN
  Administered 2020-06-23: 5 mg via INTRAVENOUS

## 2020-06-23 MED ORDER — FENTANYL CITRATE (PF) 100 MCG/2ML IJ SOLN
INTRAMUSCULAR | Status: AC
  Filled 2020-06-23: qty 2

## 2020-06-23 MED ORDER — EPHEDRINE 5 MG/ML INJ
INTRAVENOUS | Status: AC
  Filled 2020-06-23: qty 10

## 2020-06-23 MED ORDER — SODIUM CHLORIDE 0.9 % IV SOLN: INTRAVENOUS | Status: DC | PRN

## 2020-06-23 MED ORDER — DEXMEDETOMIDINE (PRECEDEX) IN NS 20 MCG/5ML (4 MCG/ML) IV SYRINGE
PREFILLED_SYRINGE | INTRAVENOUS | Status: AC
  Filled 2020-06-23: qty 10

## 2020-06-23 MED ORDER — OXYCODONE HCL 5 MG PO TABS: 5.0000 mg | ORAL_TABLET | Freq: Once | ORAL | Status: DC | PRN

## 2020-06-23 MED ORDER — LIDOCAINE 2% (20 MG/ML) 5 ML SYRINGE
INTRAMUSCULAR | Status: AC
  Filled 2020-06-23: qty 5

## 2020-06-23 MED ORDER — PROPOFOL 500 MG/50ML IV EMUL
INTRAVENOUS | Status: DC | PRN
  Administered 2020-06-23: 25 ug/kg/min via INTRAVENOUS

## 2020-06-23 MED ORDER — CLINDAMYCIN PHOSPHATE 900 MG/50ML IV SOLN
INTRAVENOUS | Status: AC
  Filled 2020-06-23: qty 50

## 2020-06-23 MED ORDER — CHLORHEXIDINE GLUCONATE CLOTH 2 % EX PADS: 6.0000 | MEDICATED_PAD | Freq: Once | CUTANEOUS | Status: DC

## 2020-06-23 MED ORDER — ONDANSETRON HCL 4 MG/2ML IJ SOLN
INTRAMUSCULAR | Status: DC | PRN
  Administered 2020-06-23: 4 mg via INTRAVENOUS

## 2020-06-23 MED ORDER — LIDOCAINE HCL (CARDIAC) PF 100 MG/5ML IV SOSY
PREFILLED_SYRINGE | INTRAVENOUS | Status: DC | PRN
  Administered 2020-06-23: 60 mg via INTRAVENOUS

## 2020-06-23 MED ORDER — PROMETHAZINE HCL 25 MG/ML IJ SOLN: 6.2500 mg | INTRAMUSCULAR | Status: DC | PRN

## 2020-06-23 MED ORDER — MEPERIDINE HCL 25 MG/ML IJ SOLN: 6.2500 mg | INTRAMUSCULAR | Status: DC | PRN

## 2020-06-23 MED ORDER — ONDANSETRON HCL 4 MG/2ML IJ SOLN
INTRAMUSCULAR | Status: AC
  Filled 2020-06-23: qty 2

## 2020-06-23 MED ORDER — MIDAZOLAM HCL 2 MG/2ML IJ SOLN
INTRAMUSCULAR | Status: DC | PRN
  Administered 2020-06-23: 2 mg via INTRAVENOUS

## 2020-06-23 MED ORDER — PROPOFOL 500 MG/50ML IV EMUL
INTRAVENOUS | Status: AC
  Filled 2020-06-23: qty 50

## 2020-06-23 MED ORDER — DEXMEDETOMIDINE (PRECEDEX) IN NS 20 MCG/5ML (4 MCG/ML) IV SYRINGE
PREFILLED_SYRINGE | INTRAVENOUS | Status: DC | PRN
  Administered 2020-06-23: 8 ug via INTRAVENOUS
  Administered 2020-06-23: 4 ug via INTRAVENOUS
  Administered 2020-06-23: 8 ug via INTRAVENOUS

## 2020-06-23 MED ORDER — EPHEDRINE SULFATE 50 MG/ML IJ SOLN
INTRAMUSCULAR | Status: DC | PRN
  Administered 2020-06-23 (×2): 10 mg via INTRAVENOUS

## 2020-06-23 MED ORDER — LACTATED RINGERS IV SOLN: INTRAVENOUS | Status: DC

## 2020-06-23 MED ORDER — FENTANYL CITRATE (PF) 100 MCG/2ML IJ SOLN
INTRAMUSCULAR | Status: DC | PRN
  Administered 2020-06-23: 100 ug via INTRAVENOUS

## 2020-06-23 MED ORDER — GLYCOPYRROLATE 0.2 MG/ML IJ SOLN
INTRAMUSCULAR | Status: DC | PRN
  Administered 2020-06-23: .1 mg via INTRAVENOUS

## 2020-06-23 MED ORDER — GLYCOPYRROLATE PF 0.2 MG/ML IJ SOSY
PREFILLED_SYRINGE | INTRAMUSCULAR | Status: AC
  Filled 2020-06-23: qty 1

## 2020-06-23 MED ORDER — PROPOFOL 10 MG/ML IV BOLUS
INTRAVENOUS | Status: DC | PRN
  Administered 2020-06-23: 200 mg via INTRAVENOUS

## 2020-06-23 MED ORDER — DEXAMETHASONE SODIUM PHOSPHATE 10 MG/ML IJ SOLN
INTRAMUSCULAR | Status: AC
  Filled 2020-06-23: qty 1

## 2020-06-23 MED ORDER — AMISULPRIDE (ANTIEMETIC) 5 MG/2ML IV SOLN: 10.0000 mg | Freq: Once | INTRAVENOUS | Status: DC | PRN

## 2020-06-23 MED ORDER — CLINDAMYCIN PHOSPHATE 900 MG/50ML IV SOLN
900.0000 mg | INTRAVENOUS | Status: AC
  Administered 2020-06-23: 900 mg via INTRAVENOUS

## 2020-06-23 MED ORDER — HYDROMORPHONE HCL 1 MG/ML IJ SOLN: 0.2500 mg | INTRAMUSCULAR | Status: DC | PRN

## 2020-06-23 SURGICAL SUPPLY — 74 items
APL PRP STRL LF DISP 70% ISPRP (MISCELLANEOUS) ×1
BAG DECANTER FOR FLEXI CONT (MISCELLANEOUS) ×2 IMPLANT
BIOPATCH RED 1 DISK 7.0 (GAUZE/BANDAGES/DRESSINGS) IMPLANT
BLADE SURG 10 STRL SS (BLADE) ×2 IMPLANT
BLADE SURG 15 STRL LF DISP TIS (BLADE) IMPLANT
BLADE SURG 15 STRL SS (BLADE)
BNDG ELASTIC 6X5.8 VLCR STR LF (GAUZE/BANDAGES/DRESSINGS) ×2 IMPLANT
BNDG GAUZE ELAST 4 BULKY (GAUZE/BANDAGES/DRESSINGS) IMPLANT
CANISTER SUCT 1200ML W/VALVE (MISCELLANEOUS) ×2 IMPLANT
CHLORAPREP W/TINT 26 (MISCELLANEOUS) ×2 IMPLANT
COVER BACK TABLE 60X90IN (DRAPES) ×2 IMPLANT
COVER MAYO STAND STRL (DRAPES) ×2 IMPLANT
COVER WAND RF STERILE (DRAPES) IMPLANT
DECANTER SPIKE VIAL GLASS SM (MISCELLANEOUS) IMPLANT
DRAIN CHANNEL 15F RND FF W/TCR (WOUND CARE) IMPLANT
DRAPE INCISE IOBAN 66X45 STRL (DRAPES) IMPLANT
DRAPE LAPAROSCOPIC ABDOMINAL (DRAPES) ×2 IMPLANT
DRAPE TOP ARMCOVERS (MISCELLANEOUS) ×2 IMPLANT
DRAPE UTILITY XL STRL (DRAPES) ×2 IMPLANT
DRSG PAD ABDOMINAL 8X10 ST (GAUZE/BANDAGES/DRESSINGS) ×4 IMPLANT
ELECT BLADE 4.0 EZ CLEAN MEGAD (MISCELLANEOUS)
ELECT REM PT RETURN 9FT ADLT (ELECTROSURGICAL) ×2
ELECTRODE BLDE 4.0 EZ CLN MEGD (MISCELLANEOUS) IMPLANT
ELECTRODE REM PT RTRN 9FT ADLT (ELECTROSURGICAL) ×1 IMPLANT
EVACUATOR SILICONE 100CC (DRAIN) IMPLANT
FUNNEL KELLER 2 DISP (MISCELLANEOUS) IMPLANT
GAUZE SPONGE 4X4 12PLY STRL (GAUZE/BANDAGES/DRESSINGS) ×2 IMPLANT
GLOVE SRG 8 PF TXTR STRL LF DI (GLOVE) ×1 IMPLANT
GLOVE SURG ENC MOIS LTX SZ6.5 (GLOVE) ×4 IMPLANT
GLOVE SURG ENC MOIS LTX SZ7.5 (GLOVE) ×2 IMPLANT
GLOVE SURG ENC TEXT LTX SZ7.5 (GLOVE) IMPLANT
GLOVE SURG UNDER POLY LF SZ7 (GLOVE) ×4 IMPLANT
GLOVE SURG UNDER POLY LF SZ8 (GLOVE) ×2
GOWN STRL REUS W/ TWL LRG LVL3 (GOWN DISPOSABLE) ×3 IMPLANT
GOWN STRL REUS W/TWL LRG LVL3 (GOWN DISPOSABLE) ×6
GRAFT FLEX HD 19X22X0.7-1.4 (Tissue) ×2 IMPLANT
IMPL BREAST GEL 430 (Breast) ×1 IMPLANT
IMPLANT BREAST GEL 430 (Breast) ×2 IMPLANT
IV NS 500ML (IV SOLUTION)
IV NS 500ML BAXH (IV SOLUTION) IMPLANT
KIT FILL SYSTEM UNIVERSAL (SET/KITS/TRAYS/PACK) IMPLANT
MARKER SKIN DUAL TIP RULER LAB (MISCELLANEOUS) IMPLANT
NEEDLE HYPO 25X1 1.5 SAFETY (NEEDLE) IMPLANT
PACK BASIN DAY SURGERY FS (CUSTOM PROCEDURE TRAY) ×2 IMPLANT
PENCIL SMOKE EVACUATOR (MISCELLANEOUS) ×2 IMPLANT
PIN SAFETY STERILE (MISCELLANEOUS) IMPLANT
SHEET MEDIUM DRAPE 40X70 STRL (DRAPES) IMPLANT
SIZER REUSABLE 430 (SIZER) ×2 IMPLANT
SLEEVE SCD COMPRESS KNEE MED (STOCKING) ×2 IMPLANT
SPONGE LAP 18X18 RF (DISPOSABLE) ×4 IMPLANT
STAPLER VISISTAT 35W (STAPLE) ×2 IMPLANT
STRIP CLOSURE SKIN 1/2X4 (GAUZE/BANDAGES/DRESSINGS) ×2 IMPLANT
SUT ETHILON 2 0 FS 18 (SUTURE) IMPLANT
SUT MON AB 3-0 SH 27 (SUTURE)
SUT MON AB 3-0 SH27 (SUTURE) IMPLANT
SUT PDS 3-0 CT2 (SUTURE) ×2
SUT PDS AB 2-0 CT2 27 (SUTURE) IMPLANT
SUT PDS II 3-0 CT2 27 ABS (SUTURE) ×1 IMPLANT
SUT VIC AB 3-0 PS1 18 (SUTURE)
SUT VIC AB 3-0 PS1 18XBRD (SUTURE) IMPLANT
SUT VIC AB 3-0 SH 27 (SUTURE)
SUT VIC AB 3-0 SH 27X BRD (SUTURE) IMPLANT
SUT VICRYL 4-0 PS2 18IN ABS (SUTURE) IMPLANT
SUT VLOC 180 0 24IN GS25 (SUTURE) IMPLANT
SUT VLOC 90 P-14 23 (SUTURE) ×2 IMPLANT
SYR 20ML LL LF (SYRINGE) IMPLANT
SYR 50ML LL SCALE MARK (SYRINGE) IMPLANT
SYR BULB IRRIG 60ML STRL (SYRINGE) ×2 IMPLANT
SYR CONTROL 10ML LL (SYRINGE) IMPLANT
TAPE MEASURE VINYL STERILE (MISCELLANEOUS) IMPLANT
TOWEL GREEN STERILE FF (TOWEL DISPOSABLE) ×4 IMPLANT
TUBE CONNECTING 20X1/4 (TUBING) ×2 IMPLANT
UNDERPAD 30X36 HEAVY ABSORB (UNDERPADS AND DIAPERS) ×4 IMPLANT
YANKAUER SUCT BULB TIP NO VENT (SUCTIONS) ×2 IMPLANT

## 2020-06-23 NOTE — Transfer of Care (Signed)
Immediate Anesthesia Transfer of Care Note  Patient: Brandi Alvarado  Procedure(s) Performed: Revision of left breast reconstruction with exchange to a new gel implant and placement of acellular dermal matrix (Left Breast)  Patient Location: PACU  Anesthesia Type:General  Level of Consciousness: sedated and drowsy  Airway & Oxygen Therapy: Patient Spontanous Breathing and Patient connected to face mask oxygen  Post-op Assessment: Report given to RN and Post -op Vital signs reviewed and stable  Post vital signs: Reviewed and stable  Last Vitals:  Vitals Value Taken Time  BP 132/92 06/23/20 1345  Temp    Pulse 80 06/23/20 1346  Resp 15 06/23/20 1346  SpO2 100 % 06/23/20 1346  Vitals shown include unvalidated device data.  Last Pain:  Vitals:   06/23/20 1012  TempSrc: Oral  PainSc: 1          Complications: No complications documented.

## 2020-06-23 NOTE — Anesthesia Postprocedure Evaluation (Signed)
Anesthesia Post Note  Patient: Chartered loss adjuster  Procedure(s) Performed: Revision of left breast reconstruction with exchange to a new gel implant and placement of acellular dermal matrix (Left Breast)     Patient location during evaluation: PACU Anesthesia Type: General Level of consciousness: awake and alert Pain management: pain level controlled Vital Signs Assessment: post-procedure vital signs reviewed and stable Respiratory status: spontaneous breathing, nonlabored ventilation and respiratory function stable Cardiovascular status: blood pressure returned to baseline and stable Postop Assessment: no apparent nausea or vomiting Anesthetic complications: no   No complications documented.  Last Vitals:  Vitals:   06/23/20 1415 06/23/20 1432  BP: 125/78 131/81  Pulse: (!) 55 (!) 52  Resp: 18 16  Temp:  36.8 C  SpO2: 99% 99%    Last Pain:  Vitals:   06/23/20 1432  TempSrc:   PainSc: 0-No pain                 Lynda Rainwater

## 2020-06-23 NOTE — Op Note (Signed)
Operative Note   DATE OF OPERATION: 06/23/2020  SURGICAL DEPARTMENT: Plastic Surgery  PREOPERATIVE DIAGNOSES: Left breast implant rippling in the setting of reconstruction after cancer  POSTOPERATIVE DIAGNOSES:  same  PROCEDURE: 1.  Removal of left breast implant 2.  Lateral capsulorrhaphy 3.  Replacement of new highly cohesive left breast implant wrapped with acellular dermal matrix  SURGEON: Talmadge Coventry, MD  ASSISTANT: [place], RNFA The advanced practice practitioner (APP) assisted throughout the case.  The APP was essential in retraction and counter traction when needed to make the case progress smoothly.  This retraction and assistance made it possible to see the tissue plans for the procedure.  The assistance was needed for blood control, tissue re-approximation and assisted with closure of the incision site.  ANESTHESIA:  General.   COMPLICATIONS: None.   INDICATIONS FOR PROCEDURE:  The patient, Brandi Alvarado is a 40 y.o. female born on [birth year], is here for treatment of left breast implant rippling after reconstruction for cancer MRN: [id number]  CONSENT:  Informed consent was obtained directly from the patient. Risks, benefits and alternatives were fully discussed. Specific risks including but not limited to bleeding, infection, hematoma, seroma, scarring, pain, contracture, asymmetry, wound healing problems, and need for further surgery were all discussed. The patient did have an ample opportunity to have questions answered to satisfaction.   DESCRIPTION OF PROCEDURE:  The patient was taken to the operating room. SCDs were placed and antibiotics were given.  General anesthesia was administered.  The patient's operative site was prepped and draped in a sterile fashion. A time out was performed and all information was confirmed to be correct.  I started by excising her lateral oblique scar.  I then dissected down to the implant with cautery.  This was a 440 cc  implant.  A 430 cc highly cohesive gel sizer was then utilized and this seemed to fill out the pocket just fine.  It seemed to be slightly lateral than ideal position so I plan to plicate the lateral capsule upon closure.  A piece of pliable pre-Flex HD was then brought onto the field and soaked in saline for 2 minutes.  The implant I chose was a Mentor memory gel boost highly cohesive moderate plus profile gel implant with a size of 430 cc.  The serial number is [id number].  I then ran a 3-0 PDS around the periphery of the Flex HD and inserted the implant in place and tied the PDS down like a pursestring.  The implant with the acellular dermal matrix around it was then inserted into the pocket.  The pocket was previously irrigated with triple antibiotic solution and I had changed my gloves.  The lateral capsule was then oversewn with 3-0 PDS on closure to tighten it just a little bit to medialize the implant.  The skin was then closed with interrupted buried 3-0 PDS sutures, Enzor stapler, and a 3 OV lock.  Steri-Strips and a compressive dressing was applied.  The patient tolerated the procedure well.  There were no complications. The patient was allowed to wake from anesthesia, extubated and taken to the recovery room in satisfactory condition.

## 2020-06-23 NOTE — Discharge Instructions (Signed)
Activity As tolerated: NO showers for 2 days No heavy activities  Diet: Regular  Wound Care: Keep dressing clean & dry for 2 days.  Keep wrap applied with compression as much as possible.    Do not change dressings for 2 days unless soiled.  Can change if needed but make sure to reapply wrap. After two days can remove wrap and shower.  Then reapply dressings if needed and continue compression with wrap or soft sports bra. Call doctor if any unusual problems occur such as pain, excessive bleeding, unrelieved nausea/vomiting, fever &/or chills  Follow-up appointment: Scheduled for next week.   Post Anesthesia Home Care Instructions  Activity: Get plenty of rest for the remainder of the day. A responsible individual must stay with you for 24 hours following the procedure.  For the next 24 hours, DO NOT: -Drive a car -Operate machinery -Drink alcoholic beverages -Take any medication unless instructed by your physician -Make any legal decisions or sign important papers.  Meals: Start with liquid foods such as gelatin or soup. Progress to regular foods as tolerated. Avoid greasy, spicy, heavy foods. If nausea and/or vomiting occur, drink only clear liquids until the nausea and/or vomiting subsides. Call your physician if vomiting continues.  Special Instructions/Symptoms: Your throat may feel dry or sore from the anesthesia or the breathing tube placed in your throat during surgery. If this causes discomfort, gargle with warm salt water. The discomfort should disappear within 24 hours.  If you had a scopolamine patch placed behind your ear for the management of post- operative nausea and/or vomiting:  1. The medication in the patch is effective for 72 hours, after which it should be removed.  Wrap patch in a tissue and discard in the trash. Wash hands thoroughly with soap and water. 2. You may remove the patch earlier than 72 hours if you experience unpleasant side effects which may  include dry mouth, dizziness or visual disturbances. 3. Avoid touching the patch. Wash your hands with soap and water after contact with the patch.      

## 2020-06-23 NOTE — Anesthesia Preprocedure Evaluation (Signed)
Anesthesia Evaluation  Patient identified by MRN, date of birth, ID band Patient awake    Reviewed: Allergy & Precautions, NPO status , Patient's Chart, lab work & pertinent test results  Airway Mallampati: II  TM Distance: >3 FB Neck ROM: Full    Dental no notable dental hx.    Pulmonary neg pulmonary ROS, Current Smoker and Patient abstained from smoking.,    Pulmonary exam normal breath sounds clear to auscultation       Cardiovascular negative cardio ROS Normal cardiovascular exam Rhythm:Regular Rate:Normal     Neuro/Psych Anxiety negative neurological ROS  negative psych ROS   GI/Hepatic negative GI ROS, Neg liver ROS,   Endo/Other  negative endocrine ROS  Renal/GU negative Renal ROS  negative genitourinary   Musculoskeletal negative musculoskeletal ROS (+)   Abdominal   Peds negative pediatric ROS (+)  Hematology negative hematology ROS (+)   Anesthesia Other Findings   Reproductive/Obstetrics negative OB ROS                             Anesthesia Physical Anesthesia Plan  ASA: II  Anesthesia Plan: General   Post-op Pain Management:    Induction: Intravenous  PONV Risk Score and Plan: 2 and Ondansetron, Midazolam and Treatment may vary due to age or medical condition  Airway Management Planned: LMA  Additional Equipment:   Intra-op Plan:   Post-operative Plan: Extubation in OR  Informed Consent: I have reviewed the patients History and Physical, chart, labs and discussed the procedure including the risks, benefits and alternatives for the proposed anesthesia with the patient or authorized representative who has indicated his/her understanding and acceptance.     Dental advisory given  Plan Discussed with: CRNA  Anesthesia Plan Comments:         Anesthesia Quick Evaluation

## 2020-06-23 NOTE — Interval H&P Note (Signed)
History and Physical Interval Note:  06/23/2020 12:27 PM  Brandi Alvarado  has presented today for surgery, with the diagnosis of history of breast cancer.  The various methods of treatment have been discussed with the patient and family. After consideration of risks, benefits and other options for treatment, the patient has consented to  Procedure(s): Revision of left breast reconstruction with exchange to a new gel implant and placement of acellular dermal matrix (Left) as a surgical intervention.  The patient's history has been reviewed, patient examined, no change in status, stable for surgery.  I have reviewed the patient's chart and labs.  Questions were answered to the patient's satisfaction.     Cindra Presume

## 2020-06-23 NOTE — Anesthesia Procedure Notes (Signed)
Procedure Name: LMA Insertion Date/Time: 06/23/2020 12:56 PM Performed by: Collier Bullock, CRNA Pre-anesthesia Checklist: Patient identified, Emergency Drugs available, Suction available and Patient being monitored Patient Re-evaluated:Patient Re-evaluated prior to induction Oxygen Delivery Method: Circle system utilized Preoxygenation: Pre-oxygenation with 100% oxygen Induction Type: IV induction Ventilation: Mask ventilation without difficulty LMA: LMA inserted LMA Size: 4.0 Number of attempts: 1 Placement Confirmation: positive ETCO2 and breath sounds checked- equal and bilateral Tube secured with: Tape Dental Injury: Teeth and Oropharynx as per pre-operative assessment

## 2020-06-23 NOTE — Brief Op Note (Signed)
06/23/2020  1:39 PM  PATIENT:  Brandi Alvarado  40 y.o. female  PRE-OPERATIVE DIAGNOSIS:  history of breast cancer  POST-OPERATIVE DIAGNOSIS:  history of breast cancer  PROCEDURE:  Procedure(s): Revision of left breast reconstruction with exchange to a new gel implant and placement of acellular dermal matrix (Left)  SURGEON:  Surgeon(s) and Role:    * Tommi Crepeau, Steffanie Dunn, MD - Primary  PHYSICIAN ASSISTANT: [place], RNFA  ASSISTANTS: none   ANESTHESIA:   general  EBL:  25 mL   BLOOD ADMINISTERED:none  DRAINS: none   LOCAL MEDICATIONS USED:  NONE  SPECIMEN:  No Specimen  DISPOSITION OF SPECIMEN:  PATHOLOGY  COUNTS:  YES  TOURNIQUET:  * No tourniquets in log *  DICTATION: .Dragon Dictation  PLAN OF CARE: Discharge to home after PACU  PATIENT DISPOSITION:  PACU - hemodynamically stable.   Delay start of Pharmacological VTE agent (>24hrs) due to surgical blood loss or risk of bleeding: not applicable

## 2020-06-24 ENCOUNTER — Encounter (HOSPITAL_BASED_OUTPATIENT_CLINIC_OR_DEPARTMENT_OTHER): Payer: Self-pay | Admitting: Plastic Surgery

## 2020-07-01 ENCOUNTER — Encounter: Payer: Self-pay | Admitting: Plastic Surgery

## 2020-07-01 ENCOUNTER — Ambulatory Visit (INDEPENDENT_AMBULATORY_CARE_PROVIDER_SITE_OTHER): Payer: BC Managed Care – PPO | Admitting: Plastic Surgery

## 2020-07-01 ENCOUNTER — Other Ambulatory Visit: Payer: Self-pay

## 2020-07-01 DIAGNOSIS — C50919 Malignant neoplasm of unspecified site of unspecified female breast: Secondary | ICD-10-CM

## 2020-07-01 NOTE — Progress Notes (Signed)
Patient presents 1 week postop from revision of left breast reconstruction.  I removed her prior implant and replaced it with a more cohesive smaller gel implant and wrapped in an acellular dermal matrix.  This was meant to improve rippling and palpability of the implant and she feels like we accomplish this goal.  Its not perfect and she can still feel a little bit of ridging of the implant if her chest is in certain positions but overall is much better than before.  On exam her incisions are healing fine and the implants in good position.  I will plan to see her again in about a month's time.  All of her questions were answered.

## 2020-07-15 ENCOUNTER — Ambulatory Visit (INDEPENDENT_AMBULATORY_CARE_PROVIDER_SITE_OTHER): Payer: BC Managed Care – PPO | Admitting: Plastic Surgery

## 2020-07-15 ENCOUNTER — Other Ambulatory Visit: Payer: Self-pay

## 2020-07-15 DIAGNOSIS — C50919 Malignant neoplasm of unspecified site of unspecified female breast: Secondary | ICD-10-CM

## 2020-07-15 NOTE — Progress Notes (Signed)
Patient is a 40 year old female here for follow-up after revision of left breast reconstruction with exchange for new cohesive gel implant that was wrapped in an acellular dermal matrix by Dr. Claudia Desanctis on 06/23/2020.  She reports that she still feels a little bit of the ridging/rippling of the implant along the inferior border, specifically when she leans forward.  She reports that she is having some sensory changes along the left axilla/left lateral breast incision as well as some sensory changes in her arm.  She reports that these are occasional.  Chaperone present on exam On exam incision is intact and well-healed.  There is no erythema.  The implant is in a good position.  There is no overlying skin changes noted.  No visual rippling or wrinkling is noted externally.  Recommend continue her compressive garment.  Recommend avoiding strenuous activities or heavy lifting greater than 15 pounds for a few more weeks.  Recommend calling with questions or concerns.  Recommend following up in about a month for reevaluation.  All of her questions were answered

## 2020-08-05 ENCOUNTER — Telehealth: Payer: Self-pay

## 2020-08-05 ENCOUNTER — Other Ambulatory Visit: Payer: Self-pay

## 2020-08-05 ENCOUNTER — Ambulatory Visit (INDEPENDENT_AMBULATORY_CARE_PROVIDER_SITE_OTHER): Payer: BC Managed Care – PPO | Admitting: Plastic Surgery

## 2020-08-05 DIAGNOSIS — C50919 Malignant neoplasm of unspecified site of unspecified female breast: Secondary | ICD-10-CM

## 2020-08-05 NOTE — Telephone Encounter (Signed)
Returned patients call. Verified with Dr. Claudia Desanctis patient can return to work.

## 2020-08-05 NOTE — Progress Notes (Signed)
Presents about 6 weeks out from revision of left breast reconstruction.  I did an implant exchange for a highly cohesive implant and wrapped in an acellular dermal matrix.  We were trying to smooth out some ridging of the implant that she was feeling along the inferior aspect.  She feels like this is a little better but still somewhat present for her.  On exam her skin is healed nicely and she has a nice overall shape and position of the implant.  There is some faint ridging that can be felt along the inferior aspect of the implant but it does seem smoother than before and she agrees with that.  I explained I do not have any other tools to improve upon this and she seems content with that at this point.  We will plan to let this settle out with time and she will call us again with any further questions or concerns.  All of her questions were answered today.

## 2020-08-19 ENCOUNTER — Other Ambulatory Visit: Payer: Self-pay

## 2020-08-19 ENCOUNTER — Encounter (HOSPITAL_BASED_OUTPATIENT_CLINIC_OR_DEPARTMENT_OTHER): Payer: Self-pay

## 2020-08-19 ENCOUNTER — Emergency Department (HOSPITAL_BASED_OUTPATIENT_CLINIC_OR_DEPARTMENT_OTHER)
Admission: EM | Admit: 2020-08-19 | Discharge: 2020-08-19 | Disposition: A | Discharge status: Home / Self Care | Payer: BC Managed Care – PPO | Attending: Emergency Medicine | Admitting: Emergency Medicine

## 2020-08-19 ENCOUNTER — Emergency Department (HOSPITAL_BASED_OUTPATIENT_CLINIC_OR_DEPARTMENT_OTHER): Payer: BC Managed Care – PPO

## 2020-08-19 DIAGNOSIS — F17210 Nicotine dependence, cigarettes, uncomplicated: Secondary | ICD-10-CM | POA: Diagnosis not present

## 2020-08-19 DIAGNOSIS — M545 Low back pain, unspecified: Secondary | ICD-10-CM | POA: Insufficient documentation

## 2020-08-19 DIAGNOSIS — Z853 Personal history of malignant neoplasm of breast: Secondary | ICD-10-CM | POA: Diagnosis not present

## 2020-08-19 DIAGNOSIS — R1031 Right lower quadrant pain: Secondary | ICD-10-CM | POA: Diagnosis present

## 2020-08-19 DIAGNOSIS — R109 Unspecified abdominal pain: Secondary | ICD-10-CM

## 2020-08-19 LAB — LIPASE, BLOOD: Lipase: 20 U/L (ref 11–51)

## 2020-08-19 LAB — CBC WITH DIFFERENTIAL/PLATELET
Abs Immature Granulocytes: 0.02 10*3/uL (ref 0.00–0.07)
Basophils Absolute: 0 10*3/uL (ref 0.0–0.1)
Basophils Relative: 0 %
Eosinophils Absolute: 0.1 10*3/uL (ref 0.0–0.5)
Eosinophils Relative: 1 %
HCT: 38.2 % (ref 36.0–46.0)
Hemoglobin: 13.2 g/dL (ref 12.0–15.0)
Immature Granulocytes: 0 %
Lymphocytes Relative: 28 %
Lymphs Abs: 1.9 10*3/uL (ref 0.7–4.0)
MCH: 30.9 pg (ref 26.0–34.0)
MCHC: 34.6 g/dL (ref 30.0–36.0)
MCV: 89.5 fL (ref 80.0–100.0)
Monocytes Absolute: 0.4 10*3/uL (ref 0.1–1.0)
Monocytes Relative: 6 %
Neutro Abs: 4.5 10*3/uL (ref 1.7–7.7)
Neutrophils Relative %: 65 %
Platelets: 167 10*3/uL (ref 150–400)
RBC: 4.27 MIL/uL (ref 3.87–5.11)
RDW: 11.8 % (ref 11.5–15.5)
WBC: 6.9 10*3/uL (ref 4.0–10.5)
nRBC: 0 % (ref 0.0–0.2)

## 2020-08-19 LAB — URINALYSIS, ROUTINE W REFLEX MICROSCOPIC
Bilirubin Urine: NEGATIVE
Glucose, UA: NEGATIVE mg/dL
Hgb urine dipstick: NEGATIVE
Ketones, ur: NEGATIVE mg/dL
Leukocytes,Ua: NEGATIVE
Nitrite: NEGATIVE
Protein, ur: NEGATIVE mg/dL
Specific Gravity, Urine: <1.005 — ABNORMAL LOW (ref 1.005–1.030)
pH: 6 (ref 5.0–8.0)

## 2020-08-19 LAB — COMPREHENSIVE METABOLIC PANEL
ALT: 14 U/L (ref 0–44)
AST: 18 U/L (ref 15–41)
Albumin: 3.7 g/dL (ref 3.5–5.0)
Alkaline Phosphatase: 38 U/L (ref 38–126)
Anion gap: 6 (ref 5–15)
BUN: 10 mg/dL (ref 6–20)
CO2: 25 mmol/L (ref 22–32)
Calcium: 8.4 mg/dL — ABNORMAL LOW (ref 8.9–10.3)
Chloride: 105 mmol/L (ref 98–111)
Creatinine, Ser: 0.7 mg/dL (ref 0.44–1.00)
GFR, Estimated: >60 mL/min (ref >60)
Glucose, Bld: 90 mg/dL (ref 70–99)
Potassium: 3.3 mmol/L — ABNORMAL LOW (ref 3.5–5.1)
Sodium: 136 mmol/L (ref 135–145)
Total Bilirubin: 0.7 mg/dL (ref 0.3–1.2)
Total Protein: 6.1 g/dL — ABNORMAL LOW (ref 6.5–8.1)

## 2020-08-19 LAB — PREGNANCY, URINE: Preg Test, Ur: NEGATIVE

## 2020-08-19 MED ORDER — DICYCLOMINE HCL 10 MG PO CAPS
10.0000 mg | ORAL_CAPSULE | Freq: Once | ORAL | Status: DC
  Filled 2020-08-19: qty 1

## 2020-08-19 MED ORDER — KETOROLAC TROMETHAMINE 15 MG/ML IJ SOLN
15.0000 mg | Freq: Once | INTRAMUSCULAR | Status: AC
  Administered 2020-08-19: 15 mg via INTRAVENOUS
  Filled 2020-08-19: qty 1

## 2020-08-19 MED ORDER — IOHEXOL 300 MG/ML  SOLN
100.0000 mL | Freq: Once | INTRAMUSCULAR | Status: AC | PRN
  Administered 2020-08-19: 100 mL via INTRAVENOUS

## 2020-08-19 MED ORDER — DICYCLOMINE HCL 20 MG PO TABS: 20.0000 mg | ORAL_TABLET | Freq: Two times a day (BID) | ORAL | 0 refills | Status: DC | PRN

## 2020-08-19 NOTE — ED Provider Notes (Signed)
[facility] EMERGENCY DEPARTMENT Provider Note   CSN: [id number] Arrival date & time: 08/19/20  1807     History Chief Complaint  Patient presents with   Abdominal Pain    Brandi Alvarado is a 40 y.o. female presenting for evaluation of abdominal pain.  Patient states that the past 3 days she has had abdominal pain.  Initially it was diffuse, now is more in the right lower side.  She has discomfort in her low back bilaterally.  She denies fevers, chills, chest pain, shortness of breath, cough, nausea, vomiting, urinary symptoms, normal bowel movements.  She is not taking anything for her symptoms.  She has a history of cholecystectomy, no other abdominal surgeries.  She just finished her period yesterday, states it was normal for her.  No vaginal discharge.  HPI     Past Medical History:  Diagnosis Date   Anxiety 11/17/2009   Breast cancer ([place]) 11/17/2009   Formatting of this note might be different from the original. L side- mastectomy Formatting of this note might be different from the original. 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   Encounter for sterilization 07/11/2011   Human papillomavirus in conditions  classified elsewhere and of unspecified site 02/07/2011   Vitamin D deficiency 08/26/2010   Seasonal allergies 01/14/2010   Anxiety 11/17/2009   Breast cancer ([place]) 11/17/2009   Tobacco dependency 11/17/2009    Past Surgical History:  Procedure Laterality Date   BREAST CAPSULECTOMY WITH IMPLANT EXCHANGE Left 06/23/2020   Procedure: Revision of left breast reconstruction with exchange to a new gel implant and placement of acellular dermal matrix;  Surgeon: Cindra Presume, MD;  Location: [facility];  Service: Plastics;  Laterality: Left;   BREAST SURGERY     CHOLECYSTECTOMY     MASTECTOMY  2006   Left      OB History   No obstetric history on file.     Family History  Problem Relation Age of Onset   Colon cancer Mother    Hypertension Father    Hypertension Sister     Social History   Tobacco Use   Smoking status: Every Day    Packs/day: 0.50    Pack years: 0.00    Types: Cigarettes   Smokeless tobacco: Never  Substance Use Topics   Alcohol use: Yes    Comment: rarely   Drug use: Never    Home Medications Prior to Admission medications   Medication Sig Start Date End Date Taking? Authorizing Provider  dicyclomine (BENTYL) 20 MG tablet Take 1 tablet (20 mg total) by mouth 2 (two) times daily as needed for spasms. 08/19/20  Yes Axcel Horsch, PA-C  ibuprofen (ADVIL) 400 MG tablet Take 400 mg  by mouth every 6 (six) hours as needed.    [provider]  metoprolol succinate (TOPROL-XL) 25 MG 24 hr tablet Take 0.5 tablets (12.5 mg total) by mouth daily. Take with or immediately following a meal. Patient not taking: Reported on 02/07/2020 12/13/19 03/12/20  Tobb, Godfrey Pick, DO    Allergies    Amoxicillin, Zoloft [sertraline], Tape, and Zoloft [sertraline hcl]  Review of Systems   Review of Systems  Gastrointestinal:  Positive for abdominal pain.  All other systems reviewed and are negative.  Physical Exam Updated Vital Signs BP (!) 105/50  (BP Location: Right Arm)   Pulse 66   Temp 98.4 F (36.9 C) (Oral)   Resp 18   Ht 5\' 5"  (1.651 m)   Wt 79.4 kg   LMP 08/13/2020   SpO2 99%   BMI 29.12 kg/m   Physical Exam Vitals and nursing note reviewed.  Constitutional:      General: She is not in acute distress.    Appearance: Normal appearance.  HENT:     Head: Normocephalic and atraumatic.  Eyes:     Extraocular Movements: Extraocular movements intact.     Conjunctiva/sclera: Conjunctivae normal.     Pupils: Pupils are equal, round, and reactive to light.  Cardiovascular:     Rate and Rhythm: Normal rate and regular rhythm.     Pulses: Normal pulses.  Pulmonary:     Effort: Pulmonary effort is normal. No respiratory distress.     Breath sounds: Normal breath sounds. No wheezing.     Comments: Speaking in full sentences.  Clear lung sounds in all fields. Abdominal:     General: There is no distension.     Palpations: Abdomen is soft. There is no mass.     Tenderness: There is abdominal tenderness. There is no guarding or rebound.     Comments: TTP of R periumbilical abd. No cva tenderness. Negative psoas and rovsings. No guarding or rebound.   Musculoskeletal:        General: Normal range of motion.     Cervical back: Normal range of motion and neck supple.  Skin:    General: Skin is warm and dry.     Capillary Refill: Capillary refill takes less than 2 seconds.  Neurological:     Mental Status: She is alert and oriented to person, place, and time.  Psychiatric:        Mood and Affect: Mood and affect normal.        Speech: Speech normal.        Behavior: Behavior normal.    ED Results / Procedures / Treatments   Labs (all labs ordered are listed, but only abnormal results are displayed) Labs Reviewed  COMPREHENSIVE METABOLIC PANEL - Abnormal; Notable for the following components:      Result Value   Potassium 3.3 (*)    Calcium 8.4 (*)    Total Protein 6.1 (*)    All other components within normal  limits  URINALYSIS, ROUTINE W REFLEX MICROSCOPIC - Abnormal; Notable for the following components:   Specific Gravity, Urine <1.005 (*)    All other components within normal limits  CBC WITH DIFFERENTIAL/PLATELET  LIPASE, BLOOD  PREGNANCY, URINE    EKG None  Radiology CT ABDOMEN PELVIS W CONTRAST  Result Date: 08/19/2020 CLINICAL DATA:  Right lower quadrant abdominal pain EXAM: CT ABDOMEN AND PELVIS WITH CONTRAST TECHNIQUE: Multidetector CT imaging of the abdomen and pelvis was performed using the standard protocol following bolus administration  of intravenous contrast. CONTRAST:  178mL OMNIPAQUE IOHEXOL 300 MG/ML  SOLN COMPARISON:  None. FINDINGS: Lower chest: The visualized lung bases are clear bilaterally. The visualized heart and pericardium are unremarkable. Hepatobiliary: The liver is unremarkable. No intra or extrahepatic biliary ductal dilation. Gallbladder absent. Pancreas: Unremarkable Spleen: Unremarkable Adrenals/Urinary Tract: Adrenal glands are unremarkable. Kidneys are normal, without renal calculi, focal lesion, or hydronephrosis. Bladder is unremarkable. Stomach/Bowel: Stomach is within normal limits. Appendix appears normal. No evidence of bowel wall thickening, distention, or inflammatory changes. No free intraperitoneal gas. Trace free fluid within the pelvis is possibly physiologic in a female patient of this age. Vascular/Lymphatic: No significant vascular findings are present. No enlarged abdominal or pelvic lymph nodes. Reproductive: 25 mm probable dominant follicle noted within the right ovary. Uterus and bilateral adnexa are otherwise unremarkable. Other: No abdominal wall hernia identified. The rectum is unremarkable. Musculoskeletal: No acute bone abnormality. No lytic or blastic bone lesion is identified. IMPRESSION: Normal examination. No definite radiographic explanation for the patient's reported symptoms. Electronically Signed   By: Fidela Salisbury MD   On: 08/19/2020  20:06    Procedures Procedures   Medications Ordered in ED Medications  dicyclomine (BENTYL) capsule 10 mg (10 mg Oral Patient Refused/Not Given 08/19/20 2101)  iohexol (OMNIPAQUE) 300 MG/ML solution 100 mL (100 mLs Intravenous Contrast Given 08/19/20 1947)  ketorolac (TORADOL) 15 MG/ML injection 15 mg (15 mg Intravenous Given 08/19/20 2038)    ED Course  I have reviewed the triage vital signs and the nursing notes.  Pertinent labs & imaging results that were available during my care of the patient were reviewed by me and considered in my medical decision making (see chart for details).    MDM Rules/Calculators/A&P                          Patient presenting for evaluation abdominal pain.  On exam, patient appears nontoxic.  However pain is to the right side of the abdomen, periumbilically.  Consider appendicitis.  Less likely as patient is afebrile and without nausea or vomiting, however due to location of pain, will obtain labs and imaging.   Urine negative for infection.  Labs interpreted by me, overall reassuring.  No leukocytosis.  Hemoglobin stable.  Kidney, liver, pancreatic function normal.  Pregnancy test negative.  CT negative for acute findings things.  Discussed with patient that at this time, there does not appear to be an acute or life-threatening event requiring hospitalization.  Discussed symptomatic treatment and follow-up with PCP as needed.  At this time, patient appears safe for discharge.  Return precautions given.  Patient states she understands and agrees to plan.  Final Clinical Impression(s) / ED Diagnoses Final diagnoses:  Right sided abdominal pain    Rx / DC Orders ED Discharge Orders          Ordered    dicyclomine (BENTYL) 20 MG tablet  2 times daily PRN        08/19/20 2029             Franchot Heidelberg, PA-C 08/19/20 2103    Lennice Sites, DO 08/19/20 2303

## 2020-08-19 NOTE — Discharge Instructions (Signed)
Take ibuprofen 3 times a day with meals as needed for pain.  Do not take other anti-inflammatories at the same time (Advil, Motrin, naproxen, Aleve). You may supplement with Tylenol if you need further pain control. Use Bentyl as needed for abdominal pain or spasm. Follow-up with your primary care doctor as needed for further evaluation of your symptoms. Return to the emergency room with any new, worsening, concerning symptoms.

## 2020-08-19 NOTE — ED Triage Notes (Signed)
Pt c/o right sided abdominal pain. States also intermittently on left as well on bilateral flanks. Denies urinary symptoms/N/V/D/fevers.

## 2020-10-26 ENCOUNTER — Other Ambulatory Visit: Payer: Self-pay

## 2020-10-26 ENCOUNTER — Encounter (HOSPITAL_BASED_OUTPATIENT_CLINIC_OR_DEPARTMENT_OTHER): Payer: Self-pay | Admitting: *Deleted

## 2020-10-26 ENCOUNTER — Emergency Department (HOSPITAL_BASED_OUTPATIENT_CLINIC_OR_DEPARTMENT_OTHER): Payer: BC Managed Care – PPO

## 2020-10-26 DIAGNOSIS — Z853 Personal history of malignant neoplasm of breast: Secondary | ICD-10-CM | POA: Insufficient documentation

## 2020-10-26 DIAGNOSIS — R002 Palpitations: Secondary | ICD-10-CM | POA: Insufficient documentation

## 2020-10-26 DIAGNOSIS — J209 Acute bronchitis, unspecified: Secondary | ICD-10-CM | POA: Insufficient documentation

## 2020-10-26 DIAGNOSIS — F17210 Nicotine dependence, cigarettes, uncomplicated: Secondary | ICD-10-CM | POA: Diagnosis not present

## 2020-10-26 DIAGNOSIS — Z20822 Contact with and (suspected) exposure to covid-19: Secondary | ICD-10-CM | POA: Diagnosis not present

## 2020-10-26 LAB — COMPREHENSIVE METABOLIC PANEL
ALT: 17 U/L (ref 0–44)
AST: 23 U/L (ref 15–41)
Albumin: 4 g/dL (ref 3.5–5.0)
Alkaline Phosphatase: 42 U/L (ref 38–126)
Anion gap: 7 (ref 5–15)
BUN: 15 mg/dL (ref 6–20)
CO2: 26 mmol/L (ref 22–32)
Calcium: 8.9 mg/dL (ref 8.9–10.3)
Chloride: 107 mmol/L (ref 98–111)
Creatinine, Ser: 0.71 mg/dL (ref 0.44–1.00)
GFR, Estimated: >60 mL/min (ref >60)
Glucose, Bld: 107 mg/dL — ABNORMAL HIGH (ref 70–99)
Potassium: 3.5 mmol/L (ref 3.5–5.1)
Sodium: 140 mmol/L (ref 135–145)
Total Bilirubin: 0.7 mg/dL (ref 0.3–1.2)
Total Protein: 6.6 g/dL (ref 6.5–8.1)

## 2020-10-26 LAB — CBC WITH DIFFERENTIAL/PLATELET
Abs Immature Granulocytes: 0.02 10*3/uL (ref 0.00–0.07)
Basophils Absolute: 0.1 10*3/uL (ref 0.0–0.1)
Basophils Relative: 1 %
Eosinophils Absolute: 0.2 10*3/uL (ref 0.0–0.5)
Eosinophils Relative: 2 %
HCT: 41 % (ref 36.0–46.0)
Hemoglobin: 14.1 g/dL (ref 12.0–15.0)
Immature Granulocytes: 0 %
Lymphocytes Relative: 32 %
Lymphs Abs: 2.3 10*3/uL (ref 0.7–4.0)
MCH: 31.3 pg (ref 26.0–34.0)
MCHC: 34.4 g/dL (ref 30.0–36.0)
MCV: 90.9 fL (ref 80.0–100.0)
Monocytes Absolute: 0.5 10*3/uL (ref 0.1–1.0)
Monocytes Relative: 8 %
Neutro Abs: 4.1 10*3/uL (ref 1.7–7.7)
Neutrophils Relative %: 57 %
Platelets: 203 10*3/uL (ref 150–400)
RBC: 4.51 MIL/uL (ref 3.87–5.11)
RDW: 12.2 % (ref 11.5–15.5)
WBC: 7.1 10*3/uL (ref 4.0–10.5)
nRBC: 0 % (ref 0.0–0.2)

## 2020-10-26 LAB — TROPONIN I (HIGH SENSITIVITY): Troponin I (High Sensitivity): 2 ng/L (ref <18)

## 2020-10-26 NOTE — ED Notes (Signed)
Patient transported to X-ray 

## 2020-10-26 NOTE — ED Triage Notes (Signed)
C/o palpitations SOB and cp x 3 hrs

## 2020-10-27 ENCOUNTER — Emergency Department (HOSPITAL_BASED_OUTPATIENT_CLINIC_OR_DEPARTMENT_OTHER)
Admission: EM | Admit: 2020-10-27 | Discharge: 2020-10-27 | Disposition: A | Discharge status: Home / Self Care | Payer: BC Managed Care – PPO | Attending: Emergency Medicine | Admitting: Emergency Medicine

## 2020-10-27 DIAGNOSIS — R002 Palpitations: Secondary | ICD-10-CM

## 2020-10-27 DIAGNOSIS — J209 Acute bronchitis, unspecified: Secondary | ICD-10-CM

## 2020-10-27 DIAGNOSIS — R079 Chest pain, unspecified: Secondary | ICD-10-CM

## 2020-10-27 LAB — RESP PANEL BY RT-PCR (FLU A&B, COVID) ARPGX2
Influenza A by PCR: NEGATIVE
Influenza B by PCR: NEGATIVE
SARS Coronavirus 2 by RT PCR: NEGATIVE

## 2020-10-27 LAB — TROPONIN I (HIGH SENSITIVITY): Troponin I (High Sensitivity): 3 ng/L (ref <18)

## 2020-10-27 LAB — MAGNESIUM: Magnesium: 1.9 mg/dL (ref 1.7–2.4)

## 2020-10-27 MED ORDER — POTASSIUM CHLORIDE CRYS ER 20 MEQ PO TBCR
40.0000 meq | EXTENDED_RELEASE_TABLET | Freq: Once | ORAL | Status: AC
  Administered 2020-10-27: 40 meq via ORAL
  Filled 2020-10-27: qty 2

## 2020-10-27 NOTE — Discharge Instructions (Addendum)
I recommend you start taking the metoprolol which had been prescribed for you.  Please try to stop smoking.

## 2020-10-27 NOTE — ED Provider Notes (Signed)
[facility] EMERGENCY DEPARTMENT Provider Note   CSN: [id number] Arrival date & time: 10/26/20  2311     History Chief Complaint  Patient presents with   Palpitations    Brandi Alvarado is a 40 y.o. female.  The history is provided by the patient.  Palpitations She has history of breast cancer, tobacco abuse, palpitations and comes in because of chest pain and palpitations.  She actually has been sick for about 2 weeks with a nonproductive cough without fever or chills.  She denies any sick contacts, but has not been vaccinated against COVID-19.  This evening, she developed palpitations like her heart was racing.  There is associated by a dull, heavy feeling in the midsternal area with radiation to the back.  Discomfort was moderate and she rates it at 6/10.  Symptoms have completely resolved since arriving in the emergency department.  There is mild associated dyspnea but no nausea or diaphoresis.  Nothing seemed to make the discomfort better, nothing made it worse.  She has had similar episodes in the past.  She denies history of hypertension, diabetes, hyperlipidemia and there is no family history of premature coronary atherosclerosis.  Of note, a cardiologist had prescribed metoprolol and she has never taken it.   Past Medical History:  Diagnosis Date   Anxiety 11/17/2009   Breast cancer ([place]) 11/17/2009   Formatting of this note might be different from the original. L side- mastectomy Formatting of this note might be different from the original. 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   Encounter for sterilization 07/11/2011   Human papillomavirus in conditions classified elsewhere and of unspecified site 02/07/2011   Vitamin D deficiency 08/26/2010   Seasonal allergies 01/14/2010   Anxiety 11/17/2009   Breast cancer ([place]) 11/17/2009   Tobacco dependency 11/17/2009    Past Surgical History:  Procedure Laterality Date   BREAST CAPSULECTOMY WITH IMPLANT EXCHANGE Left 06/23/2020   Procedure: Revision of left breast reconstruction with exchange to a new gel implant and placement of acellular dermal matrix;  Surgeon: Cindra Presume, MD;  Location: [facility];  Service: Plastics;  Laterality: Left;   BREAST SURGERY     CHOLECYSTECTOMY     MASTECTOMY  2006   Left      OB History   No obstetric history on file.     Family History  Problem Relation Age of Onset   Colon cancer Mother    Hypertension Father    Hypertension Sister     Social History   Tobacco Use   Smoking status: Every Day    Packs/day: 0.50    Types: Cigarettes   Smokeless tobacco: Never  Substance Use Topics   Alcohol use: Yes  Comment: rarely   Drug use: Never    Home Medications Prior to Admission medications   Medication Sig Start Date End Date Taking? Authorizing Provider  dicyclomine (BENTYL) 20 MG tablet Take 1 tablet (20 mg total) by mouth 2 (two) times daily as needed for spasms. 08/19/20   Caccavale, Sophia, PA-C  ibuprofen (ADVIL) 400 MG tablet Take 400 mg by mouth every 6 (six) hours as needed.    [provider]  metoprolol succinate (TOPROL-XL) 25 MG 24 hr tablet Take 0.5 tablets  (12.5 mg total) by mouth daily. Take with or immediately following a meal. Patient not taking: Reported on 02/07/2020 12/13/19 03/12/20  Tobb, Godfrey Pick, DO    Allergies    Amoxicillin, Zoloft [sertraline], Tape, and Zoloft [sertraline hcl]  Review of Systems   Review of Systems  Cardiovascular:  Positive for palpitations.  All other systems reviewed and are negative.  Physical Exam Updated Vital Signs BP 102/65 (BP Location: Right Arm)   Pulse (!) 53   Temp 98 F (36.7 C) (Oral)   Resp 16   Ht '5\' 5"'$  (1.651 m)   Wt 79.4 kg   LMP 10/19/2020   SpO2 97%   BMI 29.12 kg/m   Physical Exam Vitals and nursing note reviewed.  40 year old female, resting comfortably and in no acute distress. Vital signs are significant for slightly slow heart rate. Oxygen saturation is 97%, which is normal. Head is normocephalic and atraumatic. PERRLA, EOMI. Oropharynx is clear. Neck is nontender and supple without adenopathy or JVD. Back is nontender and there is no CVA tenderness. Lungs are clear without rales, wheezes, or rhonchi. Chest is mildly tender in the parasternal area bilaterally.  There is no crepitus. Heart has regular rate and rhythm without murmur. Abdomen is soft, flat, nontender without masses or hepatosplenomegaly and peristalsis is normoactive. Extremities have no cyanosis or edema, full range of motion is present. Skin is warm and dry without rash. Neurologic: Mental status is normal, cranial nerves are intact, there are no motor or sensory deficits.  ED Results / Procedures / Treatments   Labs (all labs ordered are listed, but only abnormal results are displayed) Labs Reviewed  COMPREHENSIVE METABOLIC PANEL - Abnormal; Notable for the following components:      Result Value   Glucose, Bld 107 (*)    All other components within normal limits  RESP PANEL BY RT-PCR (FLU A&B, COVID) ARPGX2  CBC WITH DIFFERENTIAL/PLATELET  MAGNESIUM  TROPONIN I (HIGH SENSITIVITY)  TROPONIN I  (HIGH SENSITIVITY)    EKG EKG Interpretation  Date/Time:  Monday October 26 2020 23:19:58 EDT Ventricular Rate:  82 PR Interval:  130 QRS Duration: 86 QT Interval:  386 QTC Calculation: 450 R Axis:   89 Text Interpretation: Normal sinus rhythm Normal ECG No old tracing to compare Confirmed by Delora Fuel ([id number]) on 10/26/2020 11:38:36 PM  Radiology DG Chest 2 View  Result Date: 10/26/2020 CLINICAL DATA:  Chest pain EXAM: CHEST - 2 VIEW COMPARISON:  None. FINDINGS: The heart size and mediastinal contours are within normal limits. Both lungs are clear. The visualized skeletal structures are unremarkable. Clips over the mediastinum on lateral view. IMPRESSION: No active cardiopulmonary disease. Electronically Signed   By: Donavan Foil M.D.   On: 10/26/2020 23:36    Procedures Procedures   Medications Ordered in ED Medications  potassium chloride SA (KLOR-CON) CR tablet 40 mEq (has no administration in time range)    ED Course  I have reviewed the triage  vital signs and the nursing notes.  Pertinent labs & imaging results that were available during my care of the patient were reviewed by me and considered in my medical decision making (see chart for details).   MDM Rules/Calculators/A&P                         Chest discomfort and palpitations.  Patient has little in the way risk factors, doubt ACS.  Symptoms resolved spontaneously, doubt pulmonary embolism.  Chest discomfort came on concurrent with palpitations and I suspect that the are related.  She has been noncompliant with beta-blockers to prevent palpitations.  ECG shows no acute changes, initial troponin is normal.  Potassium is borderline low and she is given a dose of potassium and will check magnesium level.  Old records are reviewed confirming evaluation by cardiology with diagnosis of atrial tachycardia which was supposed to be treated with low-dose metoprolol.  Magnesium is normal. Repeat troponin is normal Respiratory  pathogen panel is negative. She is reassured regarding the ED work-up. Recommended cardiology follow-up, stat taking metoprolol which had been prescribed previously.Urged to stop smoking.  Final Clinical Impression(s) / ED Diagnoses Final diagnoses:  Nonspecific chest pain  Palpitations  Acute bronchitis, unspecified organism    Rx / DC Orders ED Discharge Orders     None        Delora Fuel, MD [id number] [phone]

## 2020-10-27 NOTE — ED Notes (Signed)
Patient Alert and oriented to baseline. Stable and ambulatory to baseline. Patient verbalized understanding of the discharge instructions.  Patient belongings were taken by the patient.   

## 2021-02-09 ENCOUNTER — Telehealth: Payer: Self-pay | Admitting: Cardiology

## 2021-02-09 MED ORDER — METOPROLOL SUCCINATE ER 25 MG PO TB24: 12.5000 mg | ORAL_TABLET | Freq: Every day | ORAL | 3 refills | Status: DC

## 2021-02-09 NOTE — Telephone Encounter (Signed)
°*  STAT* If patient is at the pharmacy, call can be transferred to refill team.   1. Which medications need to be refilled? (please list name of each medication and dose if known)  new prescription for Metoprolol  2. Which pharmacy/location (including street and city if local pharmacy) is medication to be sent to?[facility]  3. Do they need a 30 day or 90 day supply? 90 days and refills

## 2021-02-09 NOTE — Telephone Encounter (Signed)
Refill sent to pharmacy.   

## 2021-02-10 ENCOUNTER — Other Ambulatory Visit: Payer: Self-pay

## 2021-02-10 ENCOUNTER — Ambulatory Visit (INDEPENDENT_AMBULATORY_CARE_PROVIDER_SITE_OTHER): Payer: BC Managed Care – PPO | Admitting: Plastic Surgery

## 2021-02-10 DIAGNOSIS — C50919 Malignant neoplasm of unspecified site of unspecified female breast: Secondary | ICD-10-CM

## 2021-02-10 NOTE — Progress Notes (Signed)
° °  Referring Provider Leilani Able, [place],  [place] [address]   CC:  Chief Complaint  Patient presents with   Follow-up      Brandi Alvarado is an 40 y.o. female.  HPI: Patient presents about 8 months out from revision of her left breast reconstruction.  She was bothered originally by some rippling inferiorly and we placed a boost highly cohesive implant and surrounded it with acellular dermal matrix.  She is still having some concerns regarding the implant.  She reports fairly persistent pain on the left side and still is bothered by the palpability of the implant inferiorly and laterally.  Review of Systems General: Denies fevers and chills  Physical Exam Vitals with BMI 10/27/2020 10/27/2020 10/27/2020  Height - - -  Weight - - -  BMI - - -  Systolic 449 675 916  Diastolic 66 76 67  Pulse 80 61 59    General:  No acute distress,  Alert and oriented, Non-Toxic, Normal speech and affect On exam everything looks to have healed nicely.  She has a good shape and contour.  I can feel some subtle rippling inferiorly and laterally as she points out.  She does not have much of an animation deformity and I do believe she has a subpectoral implant.  Assessment/Plan I long discussion with the patient about her options.  Ultimately I think some of the things that she is concerned about are inherent to having an implant reconstruction.  She is not so high on going through an autologous reconstruction.  She is considering just having the implant removed but wants to think about that for a while.  I am uncertain if any implant revision procedures would address the concerns that she currently has.  She is in to think about this and get back in touch with [place].  All of her questions were answered.  Cindra Presume 02/10/2021, 10:23 AM

## 2021-04-26 NOTE — Progress Notes (Signed)
Cardiology Office Note:    Date:  04/27/2021   ID:  Fabio Asa Piscitello, DOB [birth year], MRN [id number]  PCP:  Leilani Able, FNP  Cardiologist:  Shirlee More, MD    Referring MD: Farrel Conners*    ASSESSMENT:    1. PAT (paroxysmal atrial tachycardia) (HCC)   2. Chest pain of uncertain etiology    PLAN:    In order of problems listed above:  Stable I gave her a list of over-the-counter medications to avoid and switch to short acting metoprolol to take as needed An increased cardiovascular risk with breast cancer and chemotherapy?  Adriamycin chronic chest pain syndrome after discussion of functional testing versus CTA we will go ahead and schedule cardiac CTA   Next appointment: 1 year   Medication Adjustments/Labs and Tests Ordered: Current medicines are reviewed at length with the patient today.  Concerns regarding medicines are outlined above.  No orders of the defined types were placed in this encounter.  No orders of the defined types were placed in this encounter.   Chief Complaint  Patient presents with   Follow-up   Tachycardia    History of Present Illness:    Brandi Alvarado is a 41 y.o. female with a hx of breast cancer treated with lumpectomy and chemotherapy and paroxysmal atrial tachycardia last seen 02/07/2020.  Previous evaluation included echocardiogram performed at our practice with normal left ventricular size wall thickness systolic and diastolic function normal right ventricular size function and pulmonary artery pressure and no significant valvular abnormality.  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 She has occasional palpitation not severe or sustained has not needed to take a beta-blocker but I will switch her to a short acting preparation as needed No shortness of breath or syncope. I reviewed her Fitbit recording she has had no high rate alarms Past Medical History:  Diagnosis Date   Anxiety 11/17/2009   Breast cancer ([place]) 11/17/2009   Formatting of this note might be different from the original. L side- mastectomy Formatting of this note might be different from the original. 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   Seasonal allergies 01/14/2010   Tobacco dependency 11/17/2009   Vitamin D deficiency 08/26/2010    Past Surgical History:  Procedure Laterality Date   BREAST CAPSULECTOMY WITH IMPLANT EXCHANGE Left 06/23/2020   Procedure: Revision of left breast reconstruction with exchange to a new gel implant and placement of acellular dermal matrix;  Surgeon: Cindra Presume, MD;  Location: [facility];  Service: Plastics;  Laterality: Left;   BREAST SURGERY     CHOLECYSTECTOMY     MASTECTOMY  2006   Left     Current Medications: Current Meds  Medication Sig   ibuprofen (ADVIL) 400 MG tablet Take 400 mg by mouth every 6 (six) hours as needed.   metoprolol succinate (TOPROL-XL)  25 MG 24 hr tablet Take 0.5 tablets (12.5 mg total) by mouth daily. Take with or immediately following a meal.     Allergies:   Amoxicillin, Zoloft [sertraline], Tape, and Zoloft [sertraline hcl]   Social History   Socioeconomic History   Marital status: Married    Spouse name: Not on file   Number of children: Not on file   Years of education: Not on file   Highest education level: Not on file  Occupational History   Not on file  Tobacco Use   Smoking status: Every Day    Packs/day: 0.50    Types: Cigarettes   Smokeless tobacco: Never  Substance and Sexual Activity   Alcohol use: Yes    Comment: rarely   Drug use: Never   Sexual activity: Not on file  Other Topics Concern   Not on file  Social History Narrative   Not on file   Social Determinants of Health   Financial Resource Strain: Not on file  Food Insecurity: Not on file  Transportation Needs: Not on file  Physical Activity: Not on file  Stress: Not on file  Social Connections: Not on file     Family History: The patient's family history includes Colon cancer in her mother; Hypertension in her father and sister. ROS:   Please see the history of present illness.    All other systems reviewed and are negative.  EKGs/Labs/Other Studies Reviewed:    The following studies were reviewed today:    Recent Labs: 10/26/2020: ALT 17; BUN 15; Creatinine, Ser 0.71; Hemoglobin 14.1; Magnesium 1.9; Platelets 203; Potassium 3.5; Sodium 140  Recent Lipid Panel No results found for: CHOL, TRIG, HDL, CHOLHDL, VLDL, LDLCALC, LDLDIRECT  Physical Exam:    VS:  BP 130/80 (BP Location: Right Arm, Patient Position: Sitting, Cuff Size: Normal)    Pulse 68    Ht 5\' 5"  (1.651 m)    Wt 185 lb (83.9 kg)    SpO2 99%    BMI 30.79 kg/m     Wt Readings from Last 3 Encounters:  04/27/21 185 lb (83.9 kg)  10/26/20 175 lb (79.4 kg)  08/19/20 175 lb (79.4 kg)     GEN:  Well nourished, well developed in no acute distress HEENT:  Normal NECK: No JVD; No carotid bruits LYMPHATICS: No lymphadenopathy CARDIAC: RRR, no murmurs, rubs, gallops RESPIRATORY:  Clear to auscultation without rales, wheezing or rhonchi  ABDOMEN: Soft, non-tender, non-distended MUSCULOSKELETAL:  No edema; No deformity  SKIN: Warm and dry NEUROLOGIC:  Alert and oriented x 3 PSYCHIATRIC:  Normal affect    Signed, Shirlee More, MD  04/27/2021 9:45 AM    [facility]

## 2021-04-27 ENCOUNTER — Other Ambulatory Visit: Payer: Self-pay

## 2021-04-27 ENCOUNTER — Encounter: Payer: Self-pay | Admitting: Cardiology

## 2021-04-27 ENCOUNTER — Ambulatory Visit (INDEPENDENT_AMBULATORY_CARE_PROVIDER_SITE_OTHER): Payer: BC Managed Care – PPO | Admitting: Cardiology

## 2021-04-27 VITALS — BP 130/80 | HR 68 | Ht 65.0 in | Wt 185.0 lb

## 2021-04-27 DIAGNOSIS — I471 Supraventricular tachycardia: Secondary | ICD-10-CM

## 2021-04-27 DIAGNOSIS — R072 Precordial pain: Secondary | ICD-10-CM | POA: Diagnosis not present

## 2021-04-27 DIAGNOSIS — R079 Chest pain, unspecified: Secondary | ICD-10-CM

## 2021-04-27 MED ORDER — METOPROLOL TARTRATE 25 MG PO TABS: 25.0000 mg | ORAL_TABLET | Freq: Every day | ORAL | 3 refills | Status: DC

## 2021-04-27 NOTE — Addendum Note (Signed)
Addended by: Truddie Hidden on: 04/27/2021 09:57 AM   Modules accepted: Orders

## 2021-04-27 NOTE — Patient Instructions (Addendum)
1. Avoid all over-the-counter antihistamines except Claritin/Loratadine and Zyrtec/Cetrizine. 2. Avoid all combination including cold sinus allergies flu decongestant and sleep medications 3. You can use Robitussin DM Mucinex and Mucinex DM for cough. 4. can use Tylenol aspirin ibuprofen and naproxen but no combinations such as sleep or sinus.  Medication Instructions:  Your physician has recommended you make the following change in your medication:   Start Lopressor 25 mg one or one half daily as needed for palpations.  *If you need a refill on your cardiac medications before your next appointment, please call your pharmacy*   Lab Work: None ordered If you have labs (blood work) drawn today and your tests are completely normal, you will receive your results only by: [place] (if you have MyChart) OR A paper copy in the mail If you have any lab test that is abnormal or we need to change your treatment, we will call you to review the results.   Testing/Procedures:   Your cardiac CT will be scheduled at one of the below locations:   [facility] [address] [place], [place] [address] [phone]  [place] [address] [place], [place] [address] [phone]  If scheduled at [facility], please arrive at the [facility]'s Entrance (Entrance C2) of [facility] 30 minutes prior to test start time. You can use the FREE valet parking offered at entrance C (encouraged to control the heart rate for the test)  Proceed to the [facility] Radiology Department (first floor) to check-in and test prep.  All radiology patients and guests should use entrance C2 at [facility], accessed from [facility], even though the hospital's physical address listed is [address].    If scheduled at [facility], please arrive 15 mins early for  check-in and test prep.  Please follow these instructions carefully (unless otherwise directed):   On the Night Before the Test: Be sure to Drink plenty of water. Do not consume any caffeinated/decaffeinated beverages or chocolate 12 hours prior to your test. Do not take any antihistamines 12 hours prior to your test.   On the Day of the Test: Drink plenty of water until 1 hour prior to the test. Do not eat any food 4 hours prior to the test. You may take your regular medications prior to the test.  Take metoprolol (Lopressor) two hours prior to test. FEMALES- please wear underwire-free bra if available, avoid dresses & tight clothing       After the Test: Drink plenty of water. After receiving IV contrast, you may experience a mild flushed feeling. This is normal. On occasion, you may experience a mild rash up to 24 hours after the test. This is not dangerous. If this occurs, you can take Benadryl 25 mg and increase your fluid intake. If you experience trouble breathing, this can be serious. If it is severe call 911 IMMEDIATELY. If it is mild, please call our office.  We will call to schedule your test 2-4 weeks out understanding that some insurance companies will need an authorization prior to the service being performed.   For non-scheduling related questions, please contact the cardiac imaging nurse navigator should you have any questions/concerns: Marchia Bond, Cardiac Imaging Nurse Navigator Gordy Clement, Cardiac Imaging Nurse Navigator [facility] Direct Office Dial: [phone]   For scheduling needs, including cancellations and rescheduling, please call [place], [phone].  Follow-Up: At [facility], you and your health needs are our priority.  As part of our continuing mission to provide you with exceptional heart care, we have created designated Provider Care Teams.  These Care Teams include your primary Cardiologist (physician)  and Advanced Practice Providers (APPs -  Physician Assistants and Nurse Practitioners) who all work together to provide you with the care you need, when you need it.  We recommend signing up for the patient portal called "MyChart".  Sign up information is provided on this After Visit Summary.  MyChart is used to connect with patients for Virtual Visits (Telemedicine).  Patients are able to view lab/test results, encounter notes, upcoming appointments, etc.  Non-urgent messages can be sent to your provider as well.   To learn more about what you can do with MyChart, go to [email or web address].    Your next appointment:   12 month(s)  The format for your next appointment:   In Person  Provider:   Shirlee More, MD    Other Instructions Cardiac CT Angiogram A cardiac CT angiogram is a procedure to look at the heart and the area around the heart. It may be done to help find the cause of chest pains or other symptoms of heart disease. During this procedure, a substance called contrast dye is injected into the blood vessels in the area to be checked. A large X-ray machine, called a CT scanner, then takes detailed pictures of the heart and the surrounding area. The procedure is also sometimes called a coronary CT angiogram, coronary artery scanning, or CTA. A cardiac CT angiogram allows the health care provider to see how well blood is flowing to and from the heart. The health care provider will be able to see if there are any problems, such as: Blockage or narrowing of the coronary arteries in the heart. Fluid around the heart. Signs of weakness or disease in the muscles, valves, and tissues of the heart. Tell a health care provider about: Any allergies you have. This is especially important if you have had a previous allergic reaction to contrast dye. All medicines you are taking, including vitamins, herbs, eye drops, creams, and over-the-counter medicines. Any blood disorders you have. Any  surgeries you have had. Any medical conditions you have. Whether you are pregnant or may be pregnant. Any anxiety disorders, chronic pain, or other conditions you have that may increase your stress or prevent you from lying still. What are the risks? Generally, this is a safe procedure. However, problems may occur, including: Bleeding. Infection. Allergic reactions to medicines or dyes. Damage to other structures or organs. Kidney damage from the contrast dye that is used. Increased risk of cancer from radiation exposure. This risk is low. Talk with your health care provider about: The risks and benefits of testing. How you can receive the lowest dose of radiation. What happens before the procedure? Wear comfortable clothing and remove any jewelry, glasses, dentures, and hearing aids. Follow instructions from your health care provider about eating and drinking. This may include: For 12 hours before the procedure -- avoid caffeine. This includes tea, coffee, soda, energy drinks, and diet pills. Drink plenty of water or other fluids that do not have caffeine in them. Being well hydrated can prevent complications. For 4-6 hours before the procedure -- stop eating and drinking. The contrast dye can cause nausea, but this is less likely if your stomach is empty. Ask your health care provider about changing or stopping your regular medicines. This  is especially important if you are taking diabetes medicines, blood thinners, or medicines to treat problems with erections (erectile dysfunction). What happens during the procedure?  Hair on your chest may need to be removed so that small sticky patches called electrodes can be placed on your chest. These will transmit information that helps to monitor your heart during the procedure. An IV will be inserted into one of your veins. You might be given a medicine to control your heart rate during the procedure. This will help to ensure that good images are  obtained. You will be asked to lie on an exam table. This table will slide in and out of the CT machine during the procedure. Contrast dye will be injected into the IV. You might feel warm, or you may get a metallic taste in your mouth. You will be given a medicine called nitroglycerin. This will relax or dilate the arteries in your heart. The table that you are lying on will move into the CT machine tunnel for the scan. The person running the machine will give you instructions while the scans are being done. You may be asked to: Keep your arms above your head. Hold your breath. Stay very still, even if the table is moving. When the scanning is complete, you will be moved out of the machine. The IV will be removed. The procedure may vary among health care providers and hospitals. What can I expect after the procedure? After your procedure, it is common to have: A metallic taste in your mouth from the contrast dye. A feeling of warmth. A headache from the nitroglycerin. Follow these instructions at home: Take over-the-counter and prescription medicines only as told by your health care provider. If you are told, drink enough fluid to keep your urine pale yellow. This will help to flush the contrast dye out of your body. Most people can return to their normal activities right after the procedure. Ask your health care provider what activities are safe for you. It is up to you to get the results of your procedure. Ask your health care provider, or the department that is doing the procedure, when your results will be ready. Keep all follow-up visits as told by your health care provider. This is important. Contact a health care provider if: You have any symptoms of allergy to the contrast dye. These include: Shortness of breath. Rash or hives. A racing heartbeat. Summary A cardiac CT angiogram is a procedure to look at the heart and the area around the heart. It may be done to help find the  cause of chest pains or other symptoms of heart disease. During this procedure, a large X-ray machine, called a CT scanner, takes detailed pictures of the heart and the surrounding area after a contrast dye has been injected into blood vessels in the area. Ask your health care provider about changing or stopping your regular medicines before the procedure. This is especially important if you are taking diabetes medicines, blood thinners, or medicines to treat erectile dysfunction. If you are told, drink enough fluid to keep your urine pale yellow. This will help to flush the contrast dye out of your body. This information is not intended to replace advice given to you by your health care provider. Make sure you discuss any questions you have with your health care provider. Document Revised: 10/28/2020 Document Reviewed: 10/10/2018 Elsevier Patient Education  2022 [employer].

## 2021-05-12 ENCOUNTER — Telehealth (HOSPITAL_COMMUNITY): Payer: Self-pay | Admitting: *Deleted

## 2021-05-12 NOTE — Telephone Encounter (Signed)
Reaching out to patient to offer assistance regarding upcoming cardiac imaging study; pt verbalizes understanding of appt date/time, but wishes to reschedule. New appointment made for March 29. ? ?Gordy Clement RN Navigator Cardiac Imaging ?[facility] ?[phone] office ?[phone] cell ? ? ?

## 2021-05-14 ENCOUNTER — Ambulatory Visit (HOSPITAL_COMMUNITY): Admission: RE | Admit: 2021-05-14 | Payer: Self-pay | Source: Ambulatory Visit

## 2021-05-25 ENCOUNTER — Telehealth (HOSPITAL_COMMUNITY): Payer: Self-pay | Admitting: *Deleted

## 2021-05-25 NOTE — Telephone Encounter (Signed)
Reaching out to patient to offer assistance regarding upcoming cardiac imaging study; pt verbalizes understanding of appt date/time, parking situation and where to check in, pre-test NPO status and medications ordered, and verified current allergies; name and call back number provided for further questions should they arise ? ?Gordy Clement RN Navigator Cardiac Imaging ?[facility] ?[phone] office ?[phone] cell ? ?Patient to take her PRN metoprolol tartrate two hours prior to her cardiac CT scan.  She is aware to arrive at 11am for her 11:30am scan. ?

## 2021-05-26 ENCOUNTER — Telehealth: Payer: Self-pay | Admitting: Cardiology

## 2021-05-26 ENCOUNTER — Ambulatory Visit (HOSPITAL_COMMUNITY)
Admission: RE | Admit: 2021-05-26 | Discharge: 2021-05-26 | Disposition: A | Discharge status: Home / Self Care | Payer: 59 | Source: Ambulatory Visit | Attending: Cardiology | Admitting: Cardiology

## 2021-05-26 ENCOUNTER — Other Ambulatory Visit: Payer: Self-pay

## 2021-05-26 DIAGNOSIS — R079 Chest pain, unspecified: Secondary | ICD-10-CM | POA: Diagnosis present

## 2021-05-26 DIAGNOSIS — R072 Precordial pain: Secondary | ICD-10-CM | POA: Diagnosis not present

## 2021-05-26 MED ORDER — NITROGLYCERIN 0.4 MG SL SUBL
SUBLINGUAL_TABLET | SUBLINGUAL | Status: AC
  Filled 2021-05-26: qty 2

## 2021-05-26 MED ORDER — IOHEXOL 350 MG/ML SOLN
100.0000 mL | Freq: Once | INTRAVENOUS | Status: AC | PRN
  Administered 2021-05-26: 100 mL via INTRAVENOUS

## 2021-05-26 MED ORDER — NITROGLYCERIN 0.4 MG SL SUBL
0.8000 mg | SUBLINGUAL_TABLET | Freq: Once | SUBLINGUAL | Status: AC
  Administered 2021-05-26: 0.8 mg via SUBLINGUAL

## 2021-05-26 NOTE — Telephone Encounter (Signed)
[facility] Radiology had a call report for this patient. Please call back   ?

## 2021-05-26 NOTE — Telephone Encounter (Signed)
Spoke with [facility] Radiology. Received report for this pt's coronary CTA over-read by Dr. Jobe Igo.  ?No acute cardiology findings. Requested that Dr. Bettina Gavia review impression 1 & 2.  ? ?Impression 1: Right upper lobe at 2-3 mm pulmonary nodule. Given history of breast cancer, Fleischner criteria do not apply. Suggesting chest CT for follow up in 6 months versus further eval with diagnostic chest CT more acutely.  ? 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 Recommends pre and post contrast abdominal MRI to further characterize.  ? ?Forwarded to Dr. Bettina Gavia to make aware.  ?

## 2021-06-03 ENCOUNTER — Other Ambulatory Visit: Payer: Self-pay

## 2021-06-03 DIAGNOSIS — R0602 Shortness of breath: Secondary | ICD-10-CM

## 2022-07-08 ENCOUNTER — Telehealth: Payer: Self-pay | Admitting: Cardiology

## 2022-07-08 DIAGNOSIS — I4719 Other supraventricular tachycardia: Secondary | ICD-10-CM

## 2022-07-08 MED ORDER — METOPROLOL TARTRATE 25 MG PO TABS: 25.0000 mg | ORAL_TABLET | Freq: Every day | ORAL | 3 refills | Status: DC

## 2022-07-08 NOTE — Telephone Encounter (Signed)
*  STAT* If patient is at the pharmacy, call can be transferred to refill team.   1. Which medications need to be refilled? (please list name of each medication and dose if known)   metoprolol tartrate (LOPRESSOR) 25 MG tablet    2. Which pharmacy/location (including street and city if local pharmacy) is medication to be sent to?   [id number] - [place], [place] - [address] Phone: [phone]  Fax: [phone]      3. Do they need a 30 day or 90 day supply? 30

## 2022-07-08 NOTE — Telephone Encounter (Signed)
Refill sent to [facility] [address] [place] as requested

## 2022-09-15 ENCOUNTER — Other Ambulatory Visit: Payer: Self-pay

## 2022-09-18 NOTE — Progress Notes (Unsigned)
Cardiology Office Note:    Date:  09/19/2022   ID:  Brandi Alvarado, DOB Aug 03, 1980, MRN [id number]  PCP:  Ninfa Meeker, FNP (Inactive)  Cardiologist:  Norman Herrlich, MD    Referring MD: No ref. provider found    ASSESSMENT:    1. PAT (paroxysmal atrial tachycardia)    PLAN:    In order of problems listed above:  She has done very well she has had clinical resolution of her arrhythmia she has a prescription for beta-blocker as needed avoid over-the-counter proarrhythmic drugs she is signed up in my chart in the absence of breakthrough episodes I will see her back in the office in 2 years   Next appointment: 2 years   Medication Adjustments/Labs and Tests Ordered: Current medicines are reviewed at length with the patient today.  Concerns regarding medicines are outlined above.  Orders Placed This Encounter  Procedures   EKG 12-Lead   No orders of the defined types were placed in this encounter.    History of Present Illness:    Brandi Alvarado is a 42 y.o. female with a hx of breast cancer treated with lumpectomy and chemotherapy and paroxysmal atrial tachycardia last seen 04/27/2021.  She had a preceding [facility] and referred for cardiac CTA evaluation.  Following that visit she underwent cardiac CTA showing a coronary calcium score of 0 and normal coronary arteriography.  She did have right upper lobe nodule with recommendation for repeat CT in 6 months hemangioma as was previously seen.  Compliance with diet, lifestyle and medications: Yes  She changed careers reduce stress her life and has done very well She has little no palpitation not severe not sustained and has not needed to take as needed beta-blocker She has a Fitbit watch and has had no heart rate alerts She is not having edema shortness of breath chest pain palpitation or syncope Past Medical History:  Diagnosis Date   Anxiety 11/17/2009   Breast cancer (HCC)  11/17/2009   Formatting of this note might be different from the original. L side- mastectomy Formatting of this note might be different from the original. 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      EKGs/Labs/Other Studies Reviewed:    The following studies were reviewed today:  Cardiac Studies & Procedures       ECHOCARDIOGRAM  ECHOCARDIOGRAM COMPLETE 01/07/2020  Narrative ECHOCARDIOGRAM REPORT    Patient Name:   Brandi Alvarado Date of Exam: 01/07/2020 Medical Rec #:  [id number]       Height:       65.0 in Accession #:    [id number]      Weight:  175.8 lb Date of Birth:  23-May-1980        BSA:          1.873 m Patient Age:    39 years        BP:           110/80 mmHg Patient Gender: F               HR:           86 bpm. Exam Location:  [facility]  Procedure: 2D Echo  Indications:    Dyspnea 786.09 / R06.00  History:        Patient has no prior history of Echocardiogram examinations. Breast cancer; Risk Factors:Tobacco dependency.  Sonographer:    Louie Boston Referring Phys: [id number] KARDIE TOBB  IMPRESSIONS   1. Left ventricular ejection fraction, by estimation, is 60 to 65%. The left ventricle has normal  function. The left ventricle has no regional wall motion abnormalities. Left ventricular diastolic parameters were normal. 2. Right ventricular systolic function is normal. The right ventricular size is normal. There is normal pulmonary artery systolic pressure. 3. The mitral valve is normal in structure. No evidence of mitral valve regurgitation. No evidence of mitral stenosis. 4. The aortic valve is normal in structure. Aortic valve regurgitation is not visualized. No aortic stenosis is present. 5. The inferior vena cava is normal in size with greater than 50% respiratory variability, suggesting right atrial pressure of 3 mmHg.  FINDINGS Left Ventricle: Left ventricular ejection fraction, by estimation, is 60 to 65%. The left ventricle has normal function. The left ventricle has no regional wall motion abnormalities. The left ventricular internal cavity size was normal in size. There is no left ventricular hypertrophy. Left ventricular diastolic parameters were normal.  Right Ventricle: The right ventricular size is normal. No increase in right ventricular wall thickness. Right ventricular systolic function is normal. There is normal pulmonary artery systolic pressure. The tricuspid regurgitant velocity is 2.40 m/s, and with an assumed right atrial pressure of 3 mmHg, the estimated right ventricular systolic pressure is 26.0 mmHg.  Left Atrium: Left atrial size was normal in size.  Right Atrium: Right atrial size was normal in size.  Pericardium: There is no evidence of pericardial effusion.  Mitral Valve: The mitral valve is normal in structure. No evidence of mitral valve regurgitation. No evidence of mitral valve stenosis.  Tricuspid Valve: The tricuspid valve is normal in structure. Tricuspid valve regurgitation is not demonstrated. No evidence of tricuspid stenosis.  Aortic Valve: The aortic valve is normal in structure. Aortic valve regurgitation is not visualized. No aortic stenosis  is present.  Pulmonic Valve: The pulmonic valve was normal in structure. Pulmonic valve regurgitation is not visualized. No evidence of pulmonic stenosis.  Aorta: The aortic root is normal in size and structure.  Venous: The inferior vena cava is normal in size with greater than 50% respiratory variability, suggesting right atrial pressure of 3 mmHg.  IAS/Shunts: No atrial level shunt detected by color flow Doppler.   LEFT VENTRICLE PLAX 2D LVIDd:         4.60 cm  Diastology LVIDs:         3.50 cm  LV e' medial:    7.94 cm/s LV PW:         1.00 cm  LV E/e' medial:  10.1 LV IVS:        1.00 cm  LV e' lateral:   14.60 cm/s LVOT diam:     2.00  cm  LV E/e' lateral: 5.5 LV SV:         56 LV SV Index:   30 LVOT Area:     3.14 cm   RIGHT VENTRICLE             IVC RV S prime:     12.20 cm/s  IVC diam: 1.80 cm TAPSE (M-mode): 2.2 cm  LEFT ATRIUM             Index       RIGHT ATRIUM           Index LA diam:        3.30 cm 1.76 cm/m  RA Area:     11.00 cm LA Vol (A2C):   45.1 ml 24.08 ml/m RA Volume:   24.30 ml  12.98 ml/m LA Vol (A4C):   45.8 ml 24.46 ml/m LA Biplane Vol: 48.7 ml 26.01 ml/m AORTIC VALVE LVOT Vmax:   79.10 cm/s LVOT Vmean:  55.200 cm/s LVOT VTI:    0.178 m  AORTA Ao Root diam: 3.10 cm Ao Asc diam:  3.00 cm  MITRAL VALVE               TRICUSPID VALVE MV Area (PHT): 3.60 cm    TR Peak grad:   23.0 mmHg MV Decel Time: 211 msec    TR Vmax:        240.00 cm/s MV E velocity: 80.50 cm/s MV A velocity: 66.50 cm/s  SHUNTS MV E/A ratio:  1.21        Systemic VTI:  0.18 m Systemic Diam: 2.00 cm  Gypsy Balsam MD Electronically signed by Gypsy Balsam MD Signature Date/Time: 01/07/2020/3:51:45 PM    Final    MONITORS  LONG TERM MONITOR (3-14 DAYS) 10/09/2019   CT SCANS  CT CORONARY MORPH W/CTA COR W/SCORE 05/26/2021  Addendum 05/27/2021  3:07 PM ADDENDUM REPORT: 05/27/2021 15:04  CLINICAL DATA:  Chest pain  EXAM: Cardiac/Coronary   CTA  TECHNIQUE: The patient was scanned on a [employer].  FINDINGS: A 100 kV prospective scan was triggered in the descending thoracic aorta at 111 HU's. Axial non-contrast 3 mm slices were carried out through the heart. The data set was analyzed on a dedicated work station and scored using the Agatson method. Gantry rotation speed was 250 msecs and collimation was .6 mm. No beta blockade and 0.8 mg of sl NTG was given. The 3D data set was reconstructed in 5% intervals of the 67-82 % of the R-R cycle. Diastolic phases were analyzed on a dedicated work station using MPR, MIP and VRT modes. The patient received 80 cc of contrast.  Aorta:  Normal size.  No calcifications.  No dissection.  Aortic Valve:  Trileaflet.  No calcifications.  Coronary Arteries:  Normal coronary origin.  Right dominance.  RCA is a large dominant artery that gives rise to PDA and PLA. There is no plaque.  Left main is a large artery that gives rise to LAD and LCX arteries. This artery gives rise to moderate D1 and moderate size D2.  LAD is a large vessel that has no plaque.  LCX is a non-dominant artery that gives rise to one large OM1 branch. There is no plaque.  Other findings:  Normal pulmonary vein drainage into the left atrium.  Normal left atrial appendage without a thrombus.  Normal size of the pulmonary artery.  IMPRESSION: 1. Coronary calcium score of 0. This was 0 percentile for age and sex matched  control.  2. Normal coronary origin with right dominance.  3. CAD-RADS 0. No evidence of CAD (0%). Consider non-atherosclerotic causes of chest pain.   Electronically Signed By: Gypsy Balsam M.D. On: 05/27/2021 15:04  Narrative EXAM: OVER-READ INTERPRETATION  CT CHEST  The following report is an over-read performed by radiologist Dr. Jeronimo Greaves of [facility] on 05/26/2021. This over-read does not include interpretation of cardiac or coronary anatomy  or pathology. The coronary CTA interpretation by the cardiologist is attached.  COMPARISON:  Chest radiograph 10/26/2020  FINDINGS: Vascular: Normal aortic caliber. No central pulmonary embolism, on this non-dedicated study. Note is made of moderate amount of air within the nondependent pulmonary outflow tract, likely iatrogenic.  Mediastinum/Nodes: No imaged thoracic adenopathy.  Lungs/Pleura: No pleural fluid.  Bibasilar atelectasis or scar.  Subtle 2-3 mm nodule within the inferior right upper lobe on 06/11.  Upper Abdomen: Central left hepatic lobe arterially hyperenhancing focus of 1.4 cm on 61/10. Suspect subtly apparent on image 10/2 of 08/19/2020 abdominal CT. Normal imaged portions of the spleen, stomach.  Musculoskeletal: Left breast implant.  IMPRESSION: 1.  No acute findings in the imaged extracardiac chest. 2. Right upper lobe at 2-3 mm pulmonary nodule. Given history of breast cancer, Fleischner criteria do not apply. Consider chest CT follow-up at 6 months versus further evaluation with diagnostic chest CT more acutely. 3. 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 If high clinical concern of metastatic disease, pre and post contrast abdominal MRI could be performed to more definitively characterize.  These results will be called to the ordering clinician or representative by the Radiologist Assistant, and communication documented in the PACS or [employer].  Electronically Signed: By: Jeronimo Greaves M.D. On: 05/26/2021 12:06          EKG Interpretation Date/Time:  Monday September 19 2022 16:03:58 EDT Ventricular Rate:  59 PR Interval:  128 QRS Duration:  94 QT Interval:  428 QTC Calculation: 423 R Axis:   85  Text Interpretation: Sinus bradycardia When compared with ECG of 26-Oct-2020 23:19, No significant change  was found Confirmed by Norman Herrlich ([id number]) on 09/19/2022 4:16:17 PM   Recent Labs: 06/08/2021 cholesterol 151 HDL 56 triglycerides 43  Physical Exam:    VS:  BP 118/66   Pulse (!) 59   Ht 5\' 5"  (1.651 m)   Wt 197 lb 3.2 oz (89.4 kg)   SpO2 98%   BMI 32.82 kg/m     Wt Readings from Last 3 Encounters:  09/19/22 197 lb 3.2 oz (89.4 kg)  04/27/21 185 lb (83.9 kg)  10/26/20 175 lb (79.4 kg)     GEN:  Well nourished, well developed in no acute distress HEENT: Normal NECK: No JVD; No carotid bruits LYMPHATICS: No lymphadenopathy CARDIAC: RRR, no murmurs, rubs, gallops RESPIRATORY:  Clear to auscultation without rales, wheezing or rhonchi  ABDOMEN: Soft, non-tender, non-distended MUSCULOSKELETAL:  No edema; No deformity  SKIN: Warm and dry NEUROLOGIC:  Alert and oriented x 3 PSYCHIATRIC:  Normal affect    Signed, Norman Herrlich, MD  09/19/2022 4:29 PM    [facility]

## 2022-09-19 ENCOUNTER — Ambulatory Visit: Payer: Self-pay | Attending: Cardiology | Admitting: Cardiology

## 2022-09-19 ENCOUNTER — Encounter: Payer: Self-pay | Admitting: Cardiology

## 2022-09-19 VITALS — BP 118/66 | HR 59 | Ht 65.0 in | Wt 197.2 lb

## 2022-09-19 DIAGNOSIS — I4719 Other supraventricular tachycardia: Secondary | ICD-10-CM

## 2022-09-19 NOTE — Patient Instructions (Addendum)
Medication Instructions:  Your physician recommends that you continue on your current medications as directed. Please refer to the Current Medication list given to you today.  *If you need a refill on your cardiac medications before your next appointment, please call your pharmacy*   Lab Work: None If you have labs (blood work) drawn today and your tests are completely normal, you will receive your results only by: MyChart Message (if you have MyChart) OR A paper copy in the mail If you have any lab test that is abnormal or we need to change your treatment, we will call you to review the results.   Testing/Procedures: None   Follow-Up: At [facility], you and your health needs are our priority.  As part of our continuing mission to provide you with exceptional heart care, we have created designated Provider Care Teams.  These Care Teams include your primary Cardiologist (physician) and Advanced Practice Providers (APPs -  Physician Assistants and Nurse Practitioners) who all work together to provide you with the care you need, when you need it.  We recommend signing up for the patient portal called "MyChart".  Sign up information is provided on this After Visit Summary.  MyChart is used to connect with patients for Virtual Visits (Telemedicine).  Patients are able to view lab/test results, encounter notes, upcoming appointments, etc.  Non-urgent messages can be sent to your provider as well.   To learn more about what you can do with MyChart, go to [email or web address].    Your next appointment:   2 year(s)  Provider:   Norman Herrlich, MD    Other Instructions None   1. Avoid all over-the-counter antihistamines except Claritin/Loratadine and Zyrtec/Cetrizine. 2. Avoid all combination including cold sinus allergies flu decongestant and sleep medications 3. You can use Robitussin DM Mucinex and Mucinex DM for cough. 4. can use Tylenol aspirin ibuprofen and naproxen but  no combinations such as sleep or sinus.

## 2023-08-28 ENCOUNTER — Telehealth: Payer: Self-pay

## 2023-08-28 NOTE — Telephone Encounter (Signed)
 Called the patient and she was asking 2 questions:  What kind of allergy medication is ok for her to take? Dr. Monetta had told her before that there were only two allergy medications that she could take but she doesn't remember what they were.  Is it ok if she takes Flonase?

## 2023-08-28 NOTE — Telephone Encounter (Signed)
 Pt would like to know what allergy medication is okay for her to take. Please advise

## 2023-08-29 NOTE — Telephone Encounter (Signed)
 Called the patient and informed her of Dr. Karry recommendation below to take for allergy medication:  Claritin will be fine to take  Patient verbalized understanding and had no further questions at this time.

## 2023-09-21 ENCOUNTER — Telehealth: Payer: Self-pay | Admitting: Cardiology

## 2023-09-21 NOTE — Telephone Encounter (Signed)
 STAT if HR is under 50 or over 120 (normal HR is 60-100 beats per minute)  What is your heart rate?   104  Do you have a log of your heart rate readings (document readings)?   Not available  Do you have any other symptoms?  Super tired  Patient  is concerned she wears a Fitbit and when she walks around her HR jumps to 180.  Patient has appointment scheduled on 8/22 with Brandi Hoover, NP

## 2023-09-22 ENCOUNTER — Other Ambulatory Visit: Payer: Self-pay

## 2023-09-22 NOTE — Telephone Encounter (Signed)
 Called the patient and she reported that her heart rates have been running high. The heart rates have been between 104 to 155 and it comes down to 95 at rest. She also has been feeling tired and a little SOB when the heart rates are elevated. She also has a history of GERD and she has been having a burning sensation in the center of her chest that radiates under her breasts. Dr. Monetta had prescribed her Metoprolol  but she did not take it and it was discontinued from her med list. Please advise.

## 2023-09-25 ENCOUNTER — Other Ambulatory Visit: Payer: Self-pay

## 2023-09-25 MED ORDER — METOPROLOL TARTRATE 25 MG PO TABS: 25.0000 mg | ORAL_TABLET | Freq: Two times a day (BID) | ORAL | 3 refills | Status: AC | PRN

## 2023-09-25 NOTE — Telephone Encounter (Signed)
 Called the patient and informed her of Dr. Madireddy's recommendation below:  Please send her a prescription for as needed use of beta-blocker, metoprolol  tartrate 25 mg for tachycardia heart rates above 100 bpm, 2 times a day. Thank  Patient verbalized understanding and had no further questions at this time.

## 2023-10-19 NOTE — Progress Notes (Deleted)
  Cardiology Office Note   Date:  10/19/2023  ID:  Brandi Alvarado, DOB Jul 14, 1980, MRN [id number] PCP: Merilee Hoy DASEN, FNP (Inactive)  [facility] Providers Cardiologist:  Redell Leiter, MD Cardiology APP:  Carlin Delon BROCKS, NP { Click to update primary MD,subspecialty MD or APP then REFRESH:1}    History of Present Illness Brandi Alvarado is a 43 y.o. female with a past medical history of breast cancer s/p lumpectomy and chemotherapy, paroxysmal atrial tachycardia  05/26/2021 coronary CTA calcium score of 0 01/07/2020 echo EF 60-65 10/09/2019 monitor Average heart rate 80 bpm, 50 episodes of SVT  [facility] for the evaluation and management of paroxysmal atrial tachycardia.  She had had an episode of chest pain in 2023 underwent a coronary CTA revealing a calcium score of 0, there was an incidental finding of the right upper lobe pulmonary nodule.  She was most recently evaluated by Dr. Leiter on 09/19/2023, her palpitations were controlled, no changes made to medications or plan of care, she was advised she can follow-up in 2 years.  send her a prescription for as needed use of beta-blocker, metoprolol  tartrate 25 mg for tachycardia heart rates above 100 bpm, 2 times a day.   ROS: ***  Studies Reviewed      *** Risk Assessment/Calculations {Does this patient have ATRIAL FIBRILLATION?:[phone]} No BP recorded.  {Refresh Note OR Click here to enter BP  :1}***       Physical Exam VS:  There were no vitals taken for this visit.       Wt Readings from Last 3 Encounters:  09/19/22 197 lb 3.2 oz (89.4 kg)  04/27/21 185 lb (83.9 kg)  10/26/20 175 lb (79.4 kg)    GEN: Well nourished, well developed in no acute distress NECK: No JVD; No carotid bruits CARDIAC: ***RRR, no murmurs, rubs, gallops RESPIRATORY:  Clear to auscultation without rales, wheezing or rhonchi  ABDOMEN: Soft, non-tender, non-distended EXTREMITIES:  No edema; No deformity    ASSESSMENT AND PLAN ***    {Are you ordering a CV Procedure (e.g. stress test, cath, DCCV, TEE, etc)?   Press F2        :[id number]}  Dispo: ***  Signed, Delon BROCKS Carlin, NP

## 2023-10-20 ENCOUNTER — Ambulatory Visit: Payer: Self-pay | Attending: Cardiology | Admitting: Cardiology

## 2023-11-28 IMAGING — CT CT HEART MORP W/ CTA COR W/ SCORE W/ CA W/CM &/OR W/O CM
4 of 7 series · 8 of 20 positions shown, 9 images · non-contrast
Comparison: Chest radiograph 10/26/2020
COMPARISON: Chest radiograph 10/26/2020

Addendum:
EXAM:
OVER-READ INTERPRETATION  CT CHEST

The following report is an over-read performed by radiologist Dr.
Josej Pre [REDACTED] on 05/26/2021. This over-read
does not include interpretation of cardiac or coronary anatomy or
pathology. The coronary CTA interpretation by the cardiologist is
attached.
CLINICAL DATA: Chest pain
Cardiac/Coronary  CTA
TECHNIQUE: The patient was scanned on a [employer] Force scanner.

[Series 6: best diast 79 % · axial · 0.43mm/px · z∈[-199,-157]mm · 2 of 316 slices shown]
[im 106/316  vessel]
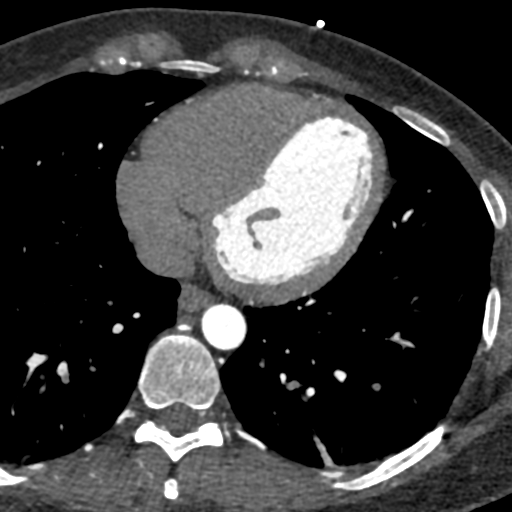
[im 211/316  vessel]
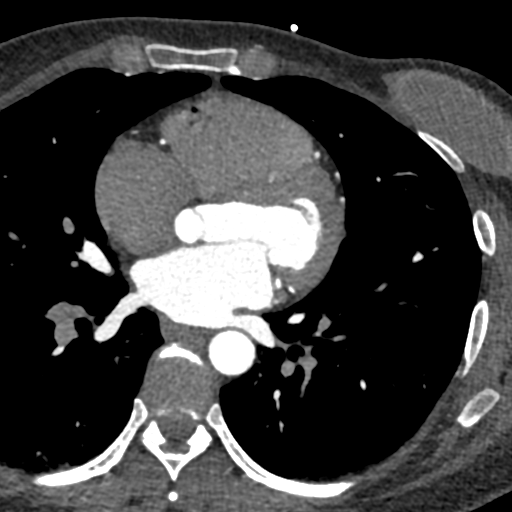

[Series 7: best syst · axial · 0.43mm/px · z∈[-199,-157]mm · 2 of 316 slices shown, 3 images]
[im 106/316  vessel]
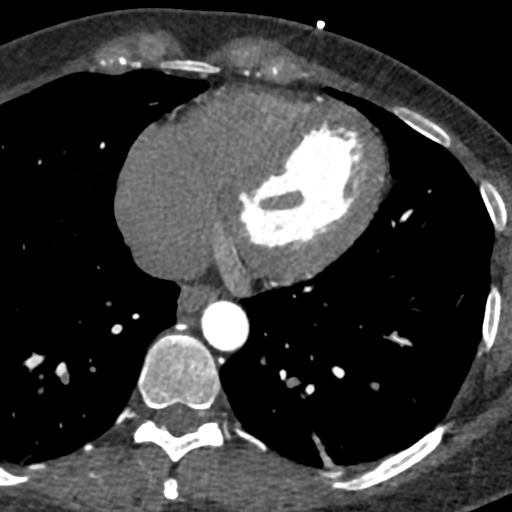
[im 106/316  lung]
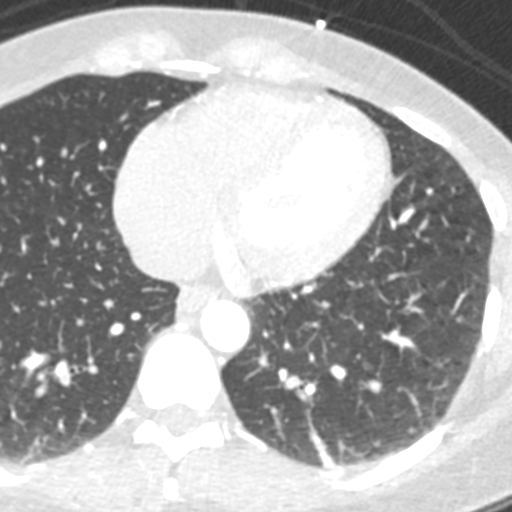
[im 211/316  vessel]
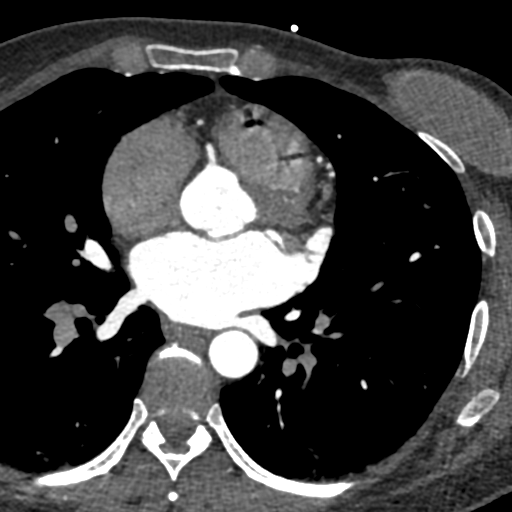

[Series 8: ts diast sharp · axial · 0.43mm/px · z∈[-199,-157]mm · 2 of 316 slices shown]
[im 106/316  lung]
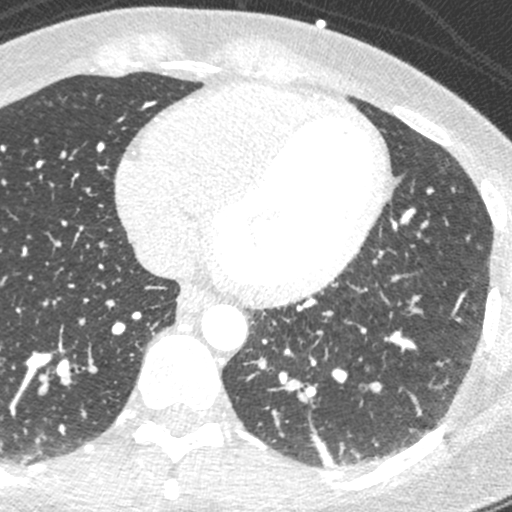
[im 211/316  lung]
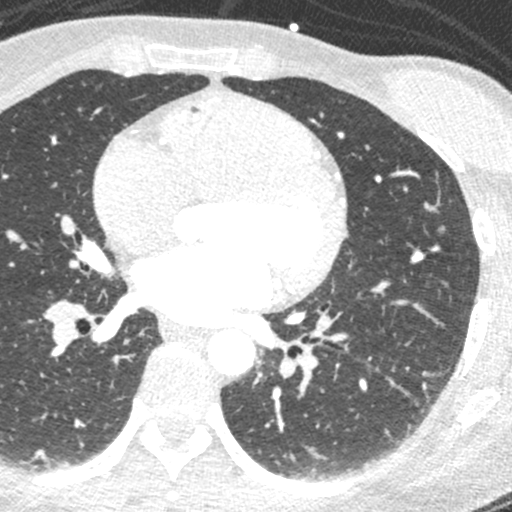

[Series 9: ts syst sharp · axial · 0.43mm/px · z∈[-199,-157]mm · 2 of 316 slices shown]
[im 106/316  lung]
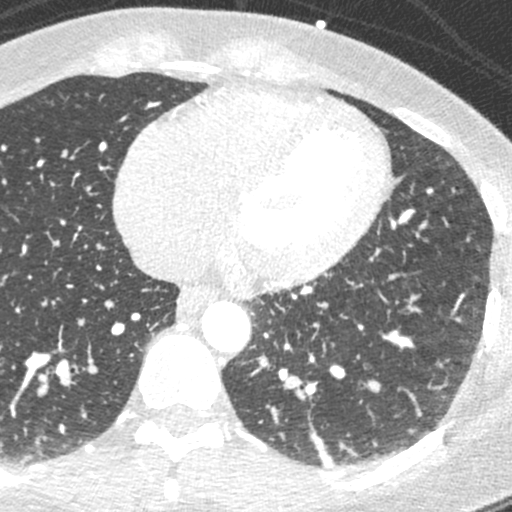
[im 211/316  lung]
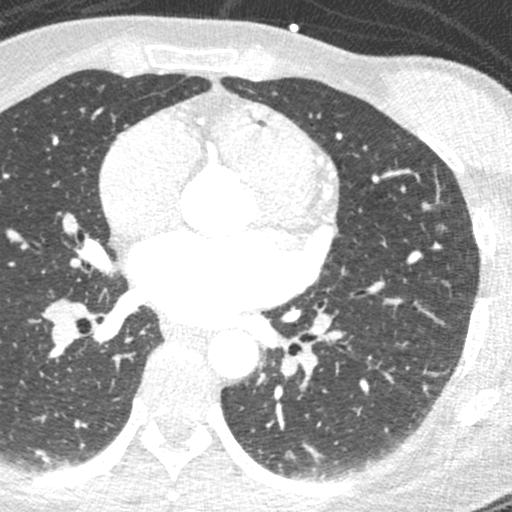

[8 of 20 positions shown; findings below may reference images not displayed]

FINDINGS: Vascular: Normal aortic caliber. No central pulmonary embolism, on
this non-dedicated study. Note is made of moderate amount of air
within the nondependent pulmonary outflow tract, likely iatrogenic.

Mediastinum/Nodes: No imaged thoracic adenopathy.

Lungs/Pleura: No pleural fluid.  Bibasilar atelectasis or scar.

Subtle 2-3 mm nodule within the inferior right upper lobe on [DATE].

Upper Abdomen: Central left hepatic lobe arterially hyperenhancing
focus of 1.4 cm on 61/10. Suspect subtly apparent on image [DATE] of
08/19/2020 abdominal CT. Normal imaged portions of the spleen,
stomach.

Musculoskeletal: Left breast implant.
IMPRESSION: 1.  No acute findings in the imaged extracardiac chest.
2. Right upper lobe at 2-3 mm pulmonary nodule. Given history of
breast cancer, Fleischner criteria do not apply. Consider chest CT
follow-up at 6 months versus further evaluation with diagnostic
chest CT more acutely.
3. 1.4 cm arterially hyperenhancing lesion within the central left
hepatic lobe is likely subtly apparent on 08/19/2020 CT and
therefore favored to represent a flash fill hemangioma. If high
clinical concern of metastatic disease, pre and post contrast
abdominal MRI could be performed to more definitively characterize.

These results will be called to the ordering clinician or
representative by the Radiologist Assistant, and communication
documented in the PACS or [REDACTED].
FINDINGS: A 100 kV prospective scan was triggered in the descending thoracic
aorta at 111 HU's. Axial non-contrast 3 mm slices were carried out
through the heart. The data set was analyzed on a dedicated work
station and scored using the Agatson method. Gantry rotation speed
was 250 msecs and collimation was .6 mm. No beta blockade and 0.8 mg
of sl NTG was given. The 3D data set was reconstructed in 5%
intervals of the 67-82 % of the R-R cycle. Diastolic phases were
analyzed on a dedicated work station using MPR, MIP and VRT modes.
The patient received 80 cc of contrast.

Aorta:  Normal size.  No calcifications.  No dissection.

Aortic Valve:  Trileaflet.  No calcifications.

Coronary Arteries:  Normal coronary origin.  Right dominance.

RCA is a large dominant artery that gives rise to PDA and PLA. There
is no plaque.

Left main is a large artery that gives rise to LAD and LCX arteries.
This artery gives rise to moderate D1 and moderate size D2.

LAD is a large vessel that has no plaque.

LCX is a non-dominant artery that gives rise to one large OM1
branch. There is no plaque.

Other findings:

Normal pulmonary vein drainage into the left atrium.

Normal left atrial appendage without a thrombus.

Normal size of the pulmonary artery.
IMPRESSION: 1. Coronary calcium score of 0. This was 0 percentile for age and
sex matched control.

2. Normal coronary origin with right dominance.

3. CAD-RADS 0. No evidence of CAD (0%). Consider non-atherosclerotic
causes of chest pain.

*** End of Addendum ***
EXAM:
OVER-READ INTERPRETATION  CT CHEST

The following report is an over-read performed by radiologist Dr.
Josej Pre [REDACTED] on 05/26/2021. This over-read
does not include interpretation of cardiac or coronary anatomy or
pathology. The coronary CTA interpretation by the cardiologist is
attached.
FINDINGS: Vascular: Normal aortic caliber. No central pulmonary embolism, on
this non-dedicated study. Note is made of moderate amount of air
within the nondependent pulmonary outflow tract, likely iatrogenic.

Mediastinum/Nodes: No imaged thoracic adenopathy.

Lungs/Pleura: No pleural fluid.  Bibasilar atelectasis or scar.

Subtle 2-3 mm nodule within the inferior right upper lobe on [DATE].

Upper Abdomen: Central left hepatic lobe arterially hyperenhancing
focus of 1.4 cm on 61/10. Suspect subtly apparent on image [DATE] of
08/19/2020 abdominal CT. Normal imaged portions of the spleen,
stomach.

Musculoskeletal: Left breast implant.
IMPRESSION: 1.  No acute findings in the imaged extracardiac chest.
2. Right upper lobe at 2-3 mm pulmonary nodule. Given history of
breast cancer, Fleischner criteria do not apply. Consider chest CT
follow-up at 6 months versus further evaluation with diagnostic
chest CT more acutely.
3. 1.4 cm arterially hyperenhancing lesion within the central left
hepatic lobe is likely subtly apparent on 08/19/2020 CT and
therefore favored to represent a flash fill hemangioma. If high
clinical concern of metastatic disease, pre and post contrast
abdominal MRI could be performed to more definitively characterize.

These results will be called to the ordering clinician or
representative by the Radiologist Assistant, and communication
documented in the PACS or [REDACTED].

## 2024-03-29 ENCOUNTER — Emergency Department (HOSPITAL_COMMUNITY): Payer: Self-pay

## 2024-03-29 ENCOUNTER — Emergency Department (HOSPITAL_COMMUNITY)
Admission: EM | Admit: 2024-03-29 | Discharge: 2024-03-29 | Disposition: A | Discharge status: Home / Self Care | Payer: Self-pay | Attending: Emergency Medicine | Admitting: Emergency Medicine

## 2024-03-29 DIAGNOSIS — R000 Tachycardia, unspecified: Secondary | ICD-10-CM | POA: Insufficient documentation

## 2024-03-29 LAB — CBC
HCT: 45.5 % (ref 36.0–46.0)
Hemoglobin: 16 g/dL — ABNORMAL HIGH (ref 12.0–15.0)
MCH: 30.7 pg (ref 26.0–34.0)
MCHC: 35.2 g/dL (ref 30.0–36.0)
MCV: 87.3 fL (ref 80.0–100.0)
Platelets: 207 10*3/uL (ref 150–400)
RBC: 5.21 MIL/uL — ABNORMAL HIGH (ref 3.87–5.11)
RDW: 11.9 % (ref 11.5–15.5)
WBC: 5.8 10*3/uL (ref 4.0–10.5)
nRBC: 0 % (ref 0.0–0.2)

## 2024-03-29 LAB — BASIC METABOLIC PANEL WITH GFR
Anion gap: 10 (ref 5–15)
BUN: 12 mg/dL (ref 6–20)
CO2: 24 mmol/L (ref 22–32)
Calcium: 9.8 mg/dL (ref 8.9–10.3)
Chloride: 105 mmol/L (ref 98–111)
Creatinine, Ser: 0.76 mg/dL (ref 0.44–1.00)
GFR, Estimated: >60 mL/min
Glucose, Bld: 105 mg/dL — ABNORMAL HIGH (ref 70–99)
Potassium: 4.2 mmol/L (ref 3.5–5.1)
Sodium: 139 mmol/L (ref 135–145)

## 2024-03-29 LAB — HCG, SERUM, QUALITATIVE: Preg, Serum: NEGATIVE

## 2024-03-29 LAB — TROPONIN T, HIGH SENSITIVITY
Troponin T High Sensitivity: 6 ng/L (ref 0–19)
Troponin T High Sensitivity: <6 ng/L (ref 0–19)

## 2024-03-29 MED ORDER — IOHEXOL 350 MG/ML SOLN
75.0000 mL | Freq: Once | INTRAVENOUS | Status: AC | PRN
  Administered 2024-03-29: 75 mL via INTRAVENOUS

## 2024-03-29 MED ORDER — METOPROLOL SUCCINATE ER 25 MG PO TB24: 12.5000 mg | ORAL_TABLET | Freq: Every day | ORAL | 0 refills | Status: AC

## 2024-03-29 NOTE — Discharge Instructions (Signed)
 1.  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  This may be occurring again.  It was not captured in the emergency department but you describe episodes of high heart rate.  Currently your evaluation is not showing any significant abnormalities.  You should follow-up with your cardiologist and may start a low-dose of metoprolol .  You will need to monitor your heart rate and blood pressure.  If your heart rate or blood pressure is getting too low or you are feeling lightheaded with standing, you will need to discontinue the metoprolol .  Stop if her heart rates less than 60 or blood pressures less than 115/60. 2.  Call your cardiology group and set a follow-up appointment soon as possible.  Return to the emergency department if you have new worsening or concerning symptoms, chest pain, feeling like you will pass out or other concerning changes.  Review all of the things to avoid in the description of sinus tachycardia.  This is likely been discussed previously but you need to avoid all stimulants such as caffeine, tobacco, over-the-counter cold medications etc.

## 2024-03-29 NOTE — ED Triage Notes (Signed)
 Pt reports was sitting down last night and HR was 45, today it was 140, pt reports palpation and notes she has hx of irregular rapid resting HR. HR 101 in triage, hx of anxiety denies s/s of anxiety recently

## 2024-03-29 NOTE — ED Notes (Signed)
 Pt ambulated unassisted and without difficulty. Pt's Spo2 ranged from 98-100% on room air, and HR ranged from 80-96 BPM

## 2024-03-29 NOTE — ED Provider Notes (Signed)
 " [place] EMERGENCY DEPARTMENT AT [facility] Provider Note   CSN: [id number] Arrival date & time: 03/29/24  1140     Patient presents with: Palpitations   Brandi Alvarado is a 44 y.o. female.  {Add pertinent medical, surgical, social history, OB history to YEP:67052} HPI Patient reports history of intermittent palpitations.  She has had some prior diagnostic evaluation and worn a 2-week monitor.  She reports she had a prior diagnosis of irregular resting heart beat and tachycardia.  Review of EMR indicates patient has diagnosis of paroxysmal atrial tachycardia.  The patient was prescribed as needed beta-blocker.  Patient had cardiac CTA with CAC of 0 and normal coronary arteriography.  Patient reports that she did pretty well for quite some time and was not having very many symptoms.  She does report that yesterday however heart rate went up as high as 140 by the watch monitor.  She reports at rest she has had some heart rates that are at about 100.  No chest pain.  Does however endorse some vague burning sensation or pressure discomfort in the chest.  No shortness of breath no near syncope.  Patient did not try a beta-blocker because symptoms usually have been pretty short-lived.    Prior to Admission medications  Medication Sig Start Date End Date Taking? Authorizing Provider  ibuprofen  (ADVIL ) 400 MG tablet Take 400 mg by mouth every 6 (six) hours as needed for headache or mild pain.    [provider]  metoprolol  tartrate (LOPRESSOR ) 25 MG tablet Take 1 tablet (25 mg total) by mouth 2 (two) times daily as needed (for heart rates greater than 100 bpm.). 09/25/23   Madireddy, Alean SAUNDERS, MD    Allergies: Amoxicillin, Zoloft [sertraline], Tape, and Zoloft [sertraline hcl]    Review of Systems  Updated Vital Signs BP 133/78 (BP Location: Right Arm)   Pulse 82   Temp 97.7 F (36.5 C) (Oral)   Resp 18   SpO2 100%   Physical Exam  (all labs ordered are  listed, but only abnormal results are displayed) Labs Reviewed  BASIC METABOLIC PANEL WITH GFR - Abnormal; Notable for the following components:      Result Value   Glucose, Bld 105 (*)    All other components within normal limits  CBC - Abnormal; Notable for the following components:   RBC 5.21 (*)    Hemoglobin 16.0 (*)    All other components within normal limits  HCG, SERUM, QUALITATIVE  TROPONIN T, HIGH SENSITIVITY  TROPONIN T, HIGH SENSITIVITY    EKG: EKG Interpretation Date/Time:  Friday March 29 2024 11:51:50 EST Ventricular Rate:  84 PR Interval:  124 QRS Duration:  100 QT Interval:  381 QTC Calculation: 451 R Axis:   87  Text Interpretation: Sinus rhythm normal, no sigg chnage from previous except rate increased Confirmed by Armenta Canning [phone]) on 03/29/2024 3:08:51 PM  Radiology: [facility] Chest Port 1 View Result Date: 03/29/2024 CLINICAL DATA:  Palpitations. EXAM: PORTABLE CHEST 1 VIEW COMPARISON:  10/26/2020. FINDINGS: The heart size and mediastinal contours are within normal limits. Both lungs are clear. No pleural effusion or pneumothorax. Clips in the left axilla. No acute osseous abnormality. IMPRESSION: No acute cardiopulmonary findings. Electronically Signed   By: Harrietta Sherry M.D.   On: 03/29/2024 13:39    {Document cardiac monitor, telemetry assessment procedure when appropriate:32947} Procedures   Medications Ordered in the ED - No data to display    {Click here for ABCD2,  HEART and other calculators REFRESH Note before signing:1}                              Medical Decision Making Amount and/or Complexity of Data Reviewed Labs: ordered.   Patient presents outlined with episodes of palpitations.  She has a formal diagnosis of PAT.  However has not been symptomatic for a while.  Apparently symptoms have just become more pronounced since about yesterday.  Reviewing the patient's CT imaging from 3\29\2023, CAC score was 0.  There was notation made  of a 2 to 3 mm pulmonary nodule and consideration for 42-month follow-up given prior history of breast cancer.  I do not see any documented repeat CT imaging.  Will perform CT chest imaging.  EKG reviewed by myself unchanged from previous.  Troponin 6 basic metabolic panel normal CBC normal except hemoglobin 16 and hematocrit 45  {Document critical care time when appropriate  Document review of labs and clinical decision tools ie [id number], etc  Document your independent review of radiology images and any outside records  Document your discussion with family members, caretakers and with consultants  Document social determinants of health affecting pt's care  Document your decision making why or why not admission, treatments were needed:32947:::1}   Final diagnoses:  None    ED Discharge Orders     None        "
